# Patient Record
Sex: Male | Born: 1980
Health system: Southern US, Community
[De-identification: ages and names within clinical notes are randomized; demographics above are authoritative.]

## PROBLEM LIST (undated history)

## (undated) DIAGNOSIS — I10 Essential (primary) hypertension: Secondary | ICD-10-CM

## (undated) DIAGNOSIS — K529 Noninfective gastroenteritis and colitis, unspecified: Secondary | ICD-10-CM

## (undated) DIAGNOSIS — F17200 Nicotine dependence, unspecified, uncomplicated: Secondary | ICD-10-CM

## (undated) DIAGNOSIS — Z8619 Personal history of other infectious and parasitic diseases: Secondary | ICD-10-CM

## (undated) HISTORY — PX: LYMPH NODE BIOPSY: SHX201

## (undated) HISTORY — DX: Noninfective gastroenteritis and colitis, unspecified: K52.9

## (undated) HISTORY — PX: TONSILLECTOMY: SUR1361

## (undated) HISTORY — DX: Personal history of other infectious and parasitic diseases: Z86.19

## (undated) HISTORY — DX: Nicotine dependence, unspecified, uncomplicated: F17.200

---

## 2011-05-11 DIAGNOSIS — K529 Noninfective gastroenteritis and colitis, unspecified: Secondary | ICD-10-CM

## 2011-05-11 HISTORY — DX: Noninfective gastroenteritis and colitis, unspecified: K52.9

## 2011-07-16 ENCOUNTER — Emergency Department: Payer: Self-pay | Admitting: Emergency Medicine

## 2011-07-16 ENCOUNTER — Ambulatory Visit: Payer: Self-pay | Admitting: Family Medicine

## 2011-07-16 LAB — CBC
HCT: 48.3 % (ref 40.0–52.0)
HGB: 15.9 g/dL (ref 13.0–18.0)
MCH: 28.2 pg (ref 26.0–34.0)
MCHC: 32.9 g/dL (ref 32.0–36.0)
MCV: 86 fL (ref 80–100)
Platelet: 166 10*3/uL (ref 150–440)
RBC: 5.65 10*6/uL (ref 4.40–5.90)
RDW: 14.1 % (ref 11.5–14.5)
WBC: 8.9 10*3/uL (ref 3.8–10.6)

## 2011-07-16 LAB — BASIC METABOLIC PANEL
Anion Gap: 9 (ref 7–16)
BUN: 15 mg/dL (ref 7–18)
Calcium, Total: 9.2 mg/dL (ref 8.5–10.1)
Chloride: 100 mmol/L (ref 98–107)
Co2: 27 mmol/L (ref 21–32)
Creatinine: 1.23 mg/dL (ref 0.60–1.30)
EGFR (African American): >60
EGFR (Non-African Amer.): >60
Glucose: 90 mg/dL (ref 65–99)
Osmolality: 272 (ref 275–301)
Potassium: 4.6 mmol/L (ref 3.5–5.1)
Sodium: 136 mmol/L (ref 136–145)

## 2011-07-16 LAB — URINALYSIS, COMPLETE
Bacteria: NONE SEEN
Bilirubin,UR: NEGATIVE
Blood: NEGATIVE
Glucose,UR: NEGATIVE mg/dL (ref 0–75)
Ketone: NEGATIVE
Leukocyte Esterase: NEGATIVE
Nitrite: NEGATIVE
Ph: 5 (ref 4.5–8.0)
Protein: NEGATIVE
RBC,UR: NONE SEEN /HPF (ref 0–5)
Specific Gravity: 1.013 (ref 1.003–1.030)
Squamous Epithelial: NONE SEEN
WBC UR: <1 /HPF (ref 0–5)

## 2011-07-17 ENCOUNTER — Encounter (HOSPITAL_COMMUNITY): Admission: EM | Disposition: A | Discharge status: Home / Self Care | Payer: Self-pay | Source: Home / Self Care

## 2011-07-17 ENCOUNTER — Emergency Department (HOSPITAL_COMMUNITY): Payer: BC Managed Care – PPO | Admitting: Anesthesiology

## 2011-07-17 ENCOUNTER — Inpatient Hospital Stay (HOSPITAL_COMMUNITY)
Admission: EM | Admit: 2011-07-17 | Discharge: 2011-07-25 | DRG: 148 | Disposition: A | Discharge status: Home / Self Care | Payer: BC Managed Care – PPO | Attending: Surgery | Admitting: Surgery

## 2011-07-17 ENCOUNTER — Encounter (HOSPITAL_COMMUNITY): Payer: Self-pay | Admitting: Anesthesiology

## 2011-07-17 ENCOUNTER — Encounter (HOSPITAL_COMMUNITY): Payer: Self-pay | Admitting: *Deleted

## 2011-07-17 DIAGNOSIS — K35209 Acute appendicitis with generalized peritonitis, without abscess, unspecified as to perforation: Secondary | ICD-10-CM

## 2011-07-17 DIAGNOSIS — I10 Essential (primary) hypertension: Secondary | ICD-10-CM | POA: Diagnosis present

## 2011-07-17 DIAGNOSIS — K5732 Diverticulitis of large intestine without perforation or abscess without bleeding: Secondary | ICD-10-CM | POA: Diagnosis present

## 2011-07-17 DIAGNOSIS — Z6833 Body mass index (BMI) 33.0-33.9, adult: Secondary | ICD-10-CM

## 2011-07-17 DIAGNOSIS — K352 Acute appendicitis with generalized peritonitis, without abscess: Principal | ICD-10-CM | POA: Diagnosis present

## 2011-07-17 DIAGNOSIS — K929 Disease of digestive system, unspecified: Secondary | ICD-10-CM | POA: Diagnosis not present

## 2011-07-17 DIAGNOSIS — R110 Nausea: Secondary | ICD-10-CM | POA: Diagnosis not present

## 2011-07-17 DIAGNOSIS — F17200 Nicotine dependence, unspecified, uncomplicated: Secondary | ICD-10-CM | POA: Diagnosis present

## 2011-07-17 DIAGNOSIS — K560 Paralytic ileus: Secondary | ICD-10-CM | POA: Diagnosis not present

## 2011-07-17 DIAGNOSIS — R1031 Right lower quadrant pain: Secondary | ICD-10-CM

## 2011-07-17 DIAGNOSIS — E669 Obesity, unspecified: Secondary | ICD-10-CM | POA: Diagnosis present

## 2011-07-17 DIAGNOSIS — Y9210 Unspecified residential institution as the place of occurrence of the external cause: Secondary | ICD-10-CM | POA: Diagnosis not present

## 2011-07-17 DIAGNOSIS — Y836 Removal of other organ (partial) (total) as the cause of abnormal reaction of the patient, or of later complication, without mention of misadventure at the time of the procedure: Secondary | ICD-10-CM | POA: Diagnosis not present

## 2011-07-17 DIAGNOSIS — K37 Unspecified appendicitis: Secondary | ICD-10-CM

## 2011-07-17 HISTORY — PX: LAPAROSCOPIC APPENDECTOMY: SHX408

## 2011-07-17 HISTORY — PX: OTHER SURGICAL HISTORY: SHX169

## 2011-07-17 HISTORY — DX: Essential (primary) hypertension: I10

## 2011-07-17 LAB — COMPREHENSIVE METABOLIC PANEL
ALT: 25 U/L (ref 0–53)
AST: 22 U/L (ref 0–37)
Albumin: 3.9 g/dL (ref 3.5–5.2)
Alkaline Phosphatase: 58 U/L (ref 39–117)
BUN: 13 mg/dL (ref 6–23)
CO2: 25 mEq/L (ref 19–32)
Calcium: 9.6 mg/dL (ref 8.4–10.5)
Chloride: 96 mEq/L (ref 96–112)
Creatinine, Ser: 1.1 mg/dL (ref 0.50–1.35)
GFR calc Af Amer: >90 mL/min (ref >90)
GFR calc non Af Amer: 88 mL/min — ABNORMAL LOW (ref >90)
Glucose, Bld: 111 mg/dL — ABNORMAL HIGH (ref 70–99)
Potassium: 4.1 mEq/L (ref 3.5–5.1)
Sodium: 132 mEq/L — ABNORMAL LOW (ref 135–145)
Total Bilirubin: 0.6 mg/dL (ref 0.3–1.2)
Total Protein: 8.5 g/dL — ABNORMAL HIGH (ref 6.0–8.3)

## 2011-07-17 LAB — DIFFERENTIAL
Basophils Absolute: 0 10*3/uL (ref 0.0–0.1)
Basophils Relative: 0 % (ref 0–1)
Eosinophils Absolute: 0 10*3/uL (ref 0.0–0.7)
Eosinophils Relative: 0 % (ref 0–5)
Lymphocytes Relative: 9 % — ABNORMAL LOW (ref 12–46)
Lymphs Abs: 1.5 10*3/uL (ref 0.7–4.0)
Monocytes Absolute: 1.9 10*3/uL — ABNORMAL HIGH (ref 0.1–1.0)
Monocytes Relative: 11 % (ref 3–12)
Neutro Abs: 14.2 10*3/uL — ABNORMAL HIGH (ref 1.7–7.7)
Neutrophils Relative %: 81 % — ABNORMAL HIGH (ref 43–77)

## 2011-07-17 LAB — URINALYSIS, ROUTINE W REFLEX MICROSCOPIC
Bilirubin Urine: NEGATIVE
Glucose, UA: NEGATIVE mg/dL
Hgb urine dipstick: NEGATIVE
Ketones, ur: 15 mg/dL — AB
Nitrite: NEGATIVE
Protein, ur: NEGATIVE mg/dL
Specific Gravity, Urine: 1.026 (ref 1.005–1.030)
Urobilinogen, UA: 1 mg/dL (ref 0.0–1.0)
pH: 6 (ref 5.0–8.0)

## 2011-07-17 LAB — CBC
HCT: 48.1 % (ref 39.0–52.0)
Hemoglobin: 16.8 g/dL (ref 13.0–17.0)
MCH: 29.4 pg (ref 26.0–34.0)
MCHC: 34.9 g/dL (ref 30.0–36.0)
MCV: 84.1 fL (ref 78.0–100.0)
Platelets: 199 10*3/uL (ref 150–400)
RBC: 5.72 MIL/uL (ref 4.22–5.81)
RDW: 13.9 % (ref 11.5–15.5)
WBC: 17.6 10*3/uL — ABNORMAL HIGH (ref 4.0–10.5)

## 2011-07-17 LAB — URINE MICROSCOPIC-ADD ON

## 2011-07-17 LAB — LIPASE, BLOOD: Lipase: 13 U/L (ref 11–59)

## 2011-07-17 SURGERY — APPENDECTOMY, LAPAROSCOPIC
Anesthesia: General | Site: Abdomen | Wound class: Dirty or Infected

## 2011-07-17 MED ORDER — FENTANYL CITRATE 0.05 MG/ML IJ SOLN
INTRAMUSCULAR | Status: DC | PRN
  Administered 2011-07-17: 100 ug via INTRAVENOUS
  Administered 2011-07-17: 50 ug via INTRAVENOUS
  Administered 2011-07-17 (×2): 100 ug via INTRAVENOUS
  Administered 2011-07-17: 150 ug via INTRAVENOUS

## 2011-07-17 MED ORDER — ONDANSETRON HCL 4 MG/2ML IJ SOLN: 4.0000 mg | Freq: Four times a day (QID) | INTRAMUSCULAR | Status: DC | PRN

## 2011-07-17 MED ORDER — NEOSTIGMINE METHYLSULFATE 1 MG/ML IJ SOLN
INTRAMUSCULAR | Status: DC | PRN
  Administered 2011-07-17: 2 mg via INTRAVENOUS
  Administered 2011-07-17: 3 mg via INTRAVENOUS

## 2011-07-17 MED ORDER — MIDAZOLAM HCL 5 MG/5ML IJ SOLN
INTRAMUSCULAR | Status: DC | PRN
  Administered 2011-07-17: 2 mg via INTRAVENOUS

## 2011-07-17 MED ORDER — ROCURONIUM BROMIDE 100 MG/10ML IV SOLN
INTRAVENOUS | Status: DC | PRN
  Administered 2011-07-17: 45 mg via INTRAVENOUS
  Administered 2011-07-17: 5 mg via INTRAVENOUS
  Administered 2011-07-17: 10 mg via INTRAVENOUS
  Administered 2011-07-17: 20 mg via INTRAVENOUS
  Administered 2011-07-17 (×2): 10 mg via INTRAVENOUS

## 2011-07-17 MED ORDER — DROPERIDOL 2.5 MG/ML IJ SOLN: 0.6250 mg | INTRAMUSCULAR | Status: DC | PRN

## 2011-07-17 MED ORDER — SODIUM CHLORIDE 0.9 % IV BOLUS (SEPSIS)
1000.0000 mL | Freq: Once | INTRAVENOUS | Status: AC
  Administered 2011-07-17: 1000 mL via INTRAVENOUS

## 2011-07-17 MED ORDER — ONDANSETRON HCL 4 MG/2ML IJ SOLN
4.0000 mg | Freq: Once | INTRAMUSCULAR | Status: AC
  Administered 2011-07-17: 4 mg via INTRAVENOUS
  Filled 2011-07-17: qty 2

## 2011-07-17 MED ORDER — SODIUM CHLORIDE 0.9 % IR SOLN
Status: DC | PRN
  Administered 2011-07-17: 2000 mL

## 2011-07-17 MED ORDER — HYDROMORPHONE HCL PF 1 MG/ML IJ SOLN
INTRAMUSCULAR | Status: AC
  Filled 2011-07-17: qty 1

## 2011-07-17 MED ORDER — KCL IN DEXTROSE-NACL 10-5-0.45 MEQ/L-%-% IV SOLN: INTRAVENOUS | Status: DC

## 2011-07-17 MED ORDER — HYDROMORPHONE HCL PF 1 MG/ML IJ SOLN
1.0000 mg | Freq: Once | INTRAMUSCULAR | Status: AC
  Administered 2011-07-17: 1 mg via INTRAVENOUS
  Filled 2011-07-17: qty 1

## 2011-07-17 MED ORDER — HYDROMORPHONE 0.3 MG/ML IV SOLN
INTRAVENOUS | Status: DC
  Administered 2011-07-17: 22:00:00 via INTRAVENOUS
  Administered 2011-07-18: 2.1 mg via INTRAVENOUS
  Administered 2011-07-18: 1.2 mg via INTRAVENOUS
  Administered 2011-07-18: 0.3 mg via INTRAVENOUS
  Administered 2011-07-18 (×3): 1.5 mg via INTRAVENOUS
  Administered 2011-07-19: 3 mg via INTRAVENOUS
  Administered 2011-07-19 (×2): 1.2 mg via INTRAVENOUS
  Administered 2011-07-19: 1.8 mg via INTRAVENOUS
  Administered 2011-07-19: 0.9 mg via INTRAVENOUS
  Administered 2011-07-19: 1.2 mg via INTRAVENOUS
  Administered 2011-07-20: 1.5 mg via INTRAVENOUS
  Administered 2011-07-20: 0.9 mg via INTRAVENOUS
  Administered 2011-07-20: 1.5 mg via INTRAVENOUS
  Administered 2011-07-20: 1.7 mg via INTRAVENOUS
  Administered 2011-07-20: 1.2 mg via INTRAVENOUS
  Administered 2011-07-20: 2.1 mg via INTRAVENOUS
  Administered 2011-07-20 – 2011-07-21 (×2): 1.5 mg via INTRAVENOUS
  Administered 2011-07-21: 1.2 mg via INTRAVENOUS
  Administered 2011-07-21: 1.8 mg via INTRAVENOUS
  Administered 2011-07-21 (×2): 0.6 mg via INTRAVENOUS
  Administered 2011-07-21: 0.3 mg via INTRAVENOUS
  Administered 2011-07-21: 04:00:00 via INTRAVENOUS
  Administered 2011-07-22 (×2): 1.5 mg via INTRAVENOUS
  Administered 2011-07-22: 0.3 mg via INTRAVENOUS
  Administered 2011-07-22: 1.5 mg via INTRAVENOUS
  Administered 2011-07-22: 1.3 mg via INTRAVENOUS
  Administered 2011-07-23: 0.9 mg via INTRAVENOUS
  Administered 2011-07-23: 13:00:00 via INTRAVENOUS
  Administered 2011-07-23 (×2): 1.5 mg via INTRAVENOUS
  Administered 2011-07-23: 0.6 mg via INTRAVENOUS
  Administered 2011-07-23 – 2011-07-24 (×2): 0.9 mg via INTRAVENOUS
  Administered 2011-07-24: 1.2 mg via INTRAVENOUS
  Administered 2011-07-24: 0.3 mg via INTRAVENOUS
  Administered 2011-07-24: 0.6 mg via INTRAVENOUS

## 2011-07-17 MED ORDER — SODIUM CHLORIDE 0.9 % IV SOLN: 3.0000 g | Freq: Four times a day (QID) | INTRAVENOUS | Status: DC

## 2011-07-17 MED ORDER — NALOXONE HCL 0.4 MG/ML IJ SOLN: 0.4000 mg | INTRAMUSCULAR | Status: DC | PRN

## 2011-07-17 MED ORDER — SUCCINYLCHOLINE CHLORIDE 20 MG/ML IJ SOLN
INTRAMUSCULAR | Status: DC | PRN
  Administered 2011-07-17: 100 mg via INTRAVENOUS

## 2011-07-17 MED ORDER — ONDANSETRON HCL 4 MG PO TABS: 4.0000 mg | ORAL_TABLET | Freq: Four times a day (QID) | ORAL | Status: DC | PRN

## 2011-07-17 MED ORDER — SODIUM CHLORIDE 0.9 % IV SOLN
3.0000 g | Freq: Once | INTRAVENOUS | Status: AC
  Administered 2011-07-17: 3 g via INTRAVENOUS
  Filled 2011-07-17: qty 3

## 2011-07-17 MED ORDER — ENOXAPARIN SODIUM 40 MG/0.4ML ~~LOC~~ SOLN: 40.0000 mg | SUBCUTANEOUS | Status: DC

## 2011-07-17 MED ORDER — BUPIVACAINE-EPINEPHRINE 0.25% -1:200000 IJ SOLN
INTRAMUSCULAR | Status: DC | PRN
  Administered 2011-07-17: 20 mL

## 2011-07-17 MED ORDER — PROPOFOL 10 MG/ML IV EMUL
INTRAVENOUS | Status: DC | PRN
  Administered 2011-07-17: 200 mg via INTRAVENOUS

## 2011-07-17 MED ORDER — VECURONIUM BROMIDE 10 MG IV SOLR
INTRAVENOUS | Status: DC | PRN
  Administered 2011-07-17: 1 mg via INTRAVENOUS
  Administered 2011-07-17: 2 mg via INTRAVENOUS

## 2011-07-17 MED ORDER — GLYCOPYRROLATE 0.2 MG/ML IJ SOLN
INTRAMUSCULAR | Status: DC | PRN
  Administered 2011-07-17: .5 mg via INTRAVENOUS
  Administered 2011-07-17: .2 mg via INTRAVENOUS

## 2011-07-17 MED ORDER — HYDROMORPHONE 0.3 MG/ML IV SOLN
INTRAVENOUS | Status: AC
  Filled 2011-07-17: qty 25

## 2011-07-17 MED ORDER — ONDANSETRON HCL 4 MG/2ML IJ SOLN
INTRAMUSCULAR | Status: DC | PRN
  Administered 2011-07-17: 4 mg via INTRAVENOUS

## 2011-07-17 MED ORDER — METRONIDAZOLE IN NACL 5-0.79 MG/ML-% IV SOLN: 500.0000 mg | Freq: Three times a day (TID) | INTRAVENOUS | Status: DC

## 2011-07-17 MED ORDER — SODIUM CHLORIDE 0.9 % IV SOLN
INTRAVENOUS | Status: DC | PRN
  Administered 2011-07-17: 19:00:00 via INTRAVENOUS

## 2011-07-17 MED ORDER — DIPHENHYDRAMINE HCL 12.5 MG/5ML PO ELIX
12.5000 mg | ORAL_SOLUTION | Freq: Four times a day (QID) | ORAL | Status: DC | PRN
  Filled 2011-07-17: qty 5

## 2011-07-17 MED ORDER — HYDROMORPHONE HCL PF 1 MG/ML IJ SOLN
0.2500 mg | INTRAMUSCULAR | Status: DC | PRN
  Administered 2011-07-17 (×4): 0.5 mg via INTRAVENOUS

## 2011-07-17 MED ORDER — LACTATED RINGERS IV SOLN
INTRAVENOUS | Status: DC | PRN
  Administered 2011-07-17 (×2): via INTRAVENOUS

## 2011-07-17 MED ORDER — DIPHENHYDRAMINE HCL 50 MG/ML IJ SOLN
12.5000 mg | Freq: Four times a day (QID) | INTRAMUSCULAR | Status: DC | PRN
  Administered 2011-07-22: 12.5 mg via INTRAVENOUS
  Filled 2011-07-17: qty 1

## 2011-07-17 MED ORDER — LABETALOL HCL 5 MG/ML IV SOLN
INTRAVENOUS | Status: AC
  Administered 2011-07-17 (×2): 5 mg via INTRAVENOUS
  Filled 2011-07-17: qty 4

## 2011-07-17 SURGICAL SUPPLY — 67 items
APPLIER CLIP ROT 10 11.4 M/L (STAPLE)
BLADE SURG 10 STRL SS (BLADE) ×3 IMPLANT
BLADE SURG ROTATE 9660 (MISCELLANEOUS) IMPLANT
CANISTER SUCTION 2500CC (MISCELLANEOUS) ×3 IMPLANT
CHLORAPREP W/TINT 26ML (MISCELLANEOUS) ×3 IMPLANT
CLIP APPLIE ROT 10 11.4 M/L (STAPLE) IMPLANT
CLOTH BEACON ORANGE TIMEOUT ST (SAFETY) ×3 IMPLANT
COVER SURGICAL LIGHT HANDLE (MISCELLANEOUS) ×3 IMPLANT
CUTTER LINEAR ENDO 35 ETS (STAPLE) IMPLANT
CUTTER LINEAR ENDO 35 ETS TH (STAPLE) IMPLANT
DECANTER SPIKE VIAL GLASS SM (MISCELLANEOUS) ×3 IMPLANT
DERMABOND ADVANCED (GAUZE/BANDAGES/DRESSINGS) ×1
DERMABOND ADVANCED .7 DNX12 (GAUZE/BANDAGES/DRESSINGS) ×2 IMPLANT
DRAPE UTILITY 15X26 W/TAPE STR (DRAPE) ×6 IMPLANT
ELECT BLADE 4.0 EZ CLEAN MEGAD (MISCELLANEOUS) ×3
ELECT REM PT RETURN 9FT ADLT (ELECTROSURGICAL) ×3
ELECTRODE BLDE 4.0 EZ CLN MEGD (MISCELLANEOUS) ×2 IMPLANT
ELECTRODE REM PT RTRN 9FT ADLT (ELECTROSURGICAL) ×2 IMPLANT
ENDOLOOP SUT PDS II  0 18 (SUTURE)
ENDOLOOP SUT PDS II 0 18 (SUTURE) IMPLANT
GLOVE BIO SURGEON STRL SZ 6.5 (GLOVE) ×3 IMPLANT
GLOVE BIO SURGEON STRL SZ7.5 (GLOVE) ×3 IMPLANT
GLOVE BIOGEL M STRL SZ7.5 (GLOVE) ×3 IMPLANT
GLOVE BIOGEL PI IND STRL 7.0 (GLOVE) ×2 IMPLANT
GLOVE BIOGEL PI IND STRL 8 (GLOVE) ×4 IMPLANT
GLOVE BIOGEL PI INDICATOR 7.0 (GLOVE) ×1
GLOVE BIOGEL PI INDICATOR 8 (GLOVE) ×2
GLOVE ECLIPSE 7.5 STRL STRAW (GLOVE) ×3 IMPLANT
GOWN PREVENTION PLUS XXLARGE (GOWN DISPOSABLE) ×3 IMPLANT
GOWN STRL NON-REIN LRG LVL3 (GOWN DISPOSABLE) ×9 IMPLANT
KIT BASIN OR (CUSTOM PROCEDURE TRAY) ×3 IMPLANT
KIT ROOM TURNOVER OR (KITS) ×3 IMPLANT
LIGASURE IMPACT 36 18CM CVD LR (INSTRUMENTS) ×3 IMPLANT
NS IRRIG 1000ML POUR BTL (IV SOLUTION) ×6 IMPLANT
PAD ARMBOARD 7.5X6 YLW CONV (MISCELLANEOUS) ×6 IMPLANT
PENCIL BUTTON HOLSTER BLD 10FT (ELECTRODE) ×3 IMPLANT
POUCH SPECIMEN RETRIEVAL 10MM (ENDOMECHANICALS) ×3 IMPLANT
RELOAD /EVU35 (ENDOMECHANICALS) IMPLANT
RELOAD CUTTER ETS 35MM STAND (ENDOMECHANICALS) IMPLANT
RELOAD PROXIMATE 75MM BLUE (ENDOMECHANICALS) ×6 IMPLANT
SET IRRIG TUBING LAPAROSCOPIC (IRRIGATION / IRRIGATOR) ×3 IMPLANT
SPECIMEN JAR SMALL (MISCELLANEOUS) ×3 IMPLANT
SPONGE GAUZE 4X4 12PLY (GAUZE/BANDAGES/DRESSINGS) ×3 IMPLANT
SPONGE LAP 18X18 X RAY DECT (DISPOSABLE) ×3 IMPLANT
STAPLER GUN LINEAR PROX 60 (STAPLE) ×3 IMPLANT
STAPLER PROXIMATE 75MM BLUE (STAPLE) ×3 IMPLANT
STAPLER VISISTAT 35W (STAPLE) ×3 IMPLANT
SUCTION POOLE TIP (SUCTIONS) ×3 IMPLANT
SUT MNCRL AB 4-0 PS2 18 (SUTURE) ×3 IMPLANT
SUT PDS II 0 TP-1 LOOPED 60 (SUTURE) ×12 IMPLANT
SUT SILK 2 0 (SUTURE) ×1
SUT SILK 2 0 SH CR/8 (SUTURE) ×3 IMPLANT
SUT SILK 2-0 18XBRD TIE 12 (SUTURE) ×2 IMPLANT
SUT SILK 3 0 (SUTURE) ×1
SUT SILK 3 0 SH CR/8 (SUTURE) ×3 IMPLANT
SUT SILK 3-0 18XBRD TIE 12 (SUTURE) ×2 IMPLANT
SYR CONTROL 10ML LL (SYRINGE) ×3 IMPLANT
TOWEL OR 17X24 6PK STRL BLUE (TOWEL DISPOSABLE) ×3 IMPLANT
TOWEL OR 17X26 10 PK STRL BLUE (TOWEL DISPOSABLE) ×3 IMPLANT
TRAY FOLEY CATH 14FR (SET/KITS/TRAYS/PACK) ×3 IMPLANT
TRAY LAPAROSCOPIC (CUSTOM PROCEDURE TRAY) ×3 IMPLANT
TROCAR XCEL 12X100 BLDLESS (ENDOMECHANICALS) ×3 IMPLANT
TROCAR XCEL BLUNT TIP 100MML (ENDOMECHANICALS) ×3 IMPLANT
TROCAR XCEL NON-BLD 5MMX100MML (ENDOMECHANICALS) ×3 IMPLANT
TUBE CONNECTING 12X1/4 (SUCTIONS) ×3 IMPLANT
WATER STERILE IRR 1000ML POUR (IV SOLUTION) IMPLANT
YANKAUER SUCT BULB TIP NO VENT (SUCTIONS) ×3 IMPLANT

## 2011-07-17 NOTE — ED Notes (Signed)
Pt consented to sx; pt in gown and report given to OR

## 2011-07-17 NOTE — Op Note (Signed)
OPERATIVE REPORT  DATE OF OPERATION: 07/17/2011  PATIENT:  Timothy Davies  31 y.o. male  PRE-OPERATIVE DIAGNOSIS:  acute appendicitis  POST-OPERATIVE DIAGNOSIS:  cecal perforation with localized peritonitis  PROCEDURE:  Procedure(s): APPENDECTOMY LAPAROSCOPIC ILEOCECETOMY  SURGEON:  Surgeon(s): Frederik Schmidt, MD Atilano Ina, MD,FACS  ASSISTANT: Andrey Campanile, MD  ANESTHESIA:   general  EBL: 150 ml  BLOOD ADMINISTERED: none  DRAINS: Nasogastric Tube and Urinary Catheter (Foley)   SPECIMEN:  Source of Specimen:  terminal ileum and cecum with perforation  COUNTS CORRECT:  YES  PROCEDURE DETAILS: The patient was taken to the operating room and placed on the table in the supine position. An adequate endotracheal anesthetic was administered she was prepped and draped in usual sterile manner exposing the entire abdomen.  After proper time out was performed identifying the patient and the procedure to be performed a supraumbilical midline incision was made using #15 blade and taken down to the midline fascia. We incised the midline fascia using 15 blade and grabbed the edges to call clamps. We bluntly dissected down into the peritoneal cavity using a Kelly clamp. A portioning suture of 0 Vicryl was passed around the fascial opening then a Hassan cannula was passed into the peritoneal cavity and secured in place with a pursestring suture. Carbon dioxide gas was insufflated into the peritoneal cavity up to a maximal pressure percent to 15 mm of mercury.  A right upper quadrant 5 mm cannula in the left lower quadrant 11 mm cannula were passed under direct vision. The patient was placed in  Trendelenburg in the left side was tilted down. The small bowel and colon in the right side was markedly inflamed. There is a necrotic inflammatory process involving the cecum which initially was thought to be a ruptured appendix in retrocecal position. As we mobilized the cecum medially taking down its lateral  attachments we do see those of perforation of the cecum anteriorly and that the appendix still was not visualized. Because of concerns of a perforated colon the decision was made to open.  Removed all cannulas and stopped all be gaseous insufflation. A right transverse abdominal incision at the level of the umbilicus made using #10 blade taken down through the anterior rectus fascia the rectus muscle the posterior rectus sheath into the peritoneal cavity.  Using which some retractors were mobilized right colon the terminal ileum which had this acutely inflamed inflammatory process within it. It did turn out that the patient had an anterior cecal wall perforation which is away from the appendix. The appendix was retrocecal but not acutely inflamed. As we mobilized the terminal ileum and right colon we were able to come across the terminal ileum using a GIA-75 stapler and the ascending colon using the similar stapler. The mesentery was taken between [employer] [facility] clamps and 2-0 silk ties and also using a LigaSure device.  We reapproximated the terminal ileum to the ascending colon using a GIA-75 stapler. A TA 60 was used to close the enterotomy and then the mesentery was closed using 2-0 silk. It was minimal fecal contamination in the right lower quadrant however we irrigated with copious amounts saline solution. Once we had done this we allowed all bowel to fall back into the peritoneal cavity we closed the abdomen using looped 0 PDS suture independently suturing the posterior anterior rectus sheaths. The irrigated subcutaneous with saline and closed the skin using stainless steel staples all needle counts sponge counts and instrument counts were correct.  PATIENT DISPOSITION:  PACU -  hemodynamically stable.   Claiborne Stroble III,Markel Kurtenbach O 3/9/20139:54 PM

## 2011-07-17 NOTE — ED Provider Notes (Signed)
History     CSN: [id number]  Arrival date & time 07/17/11  1515   First MD Initiated Contact with Patient 07/17/11 1610      Chief Complaint  Patient presents with  . Abdominal Pain    (Consider location/radiation/quality/duration/timing/severity/associated sxs/prior treatment) HPI  Patient presents to the ED with complaints of abdominal pain that started yesterday. He was seen at [facility] yesterday for the same and a CT scan was done. Per the patient, they were unable to visualize his appendix. The patient was given abx and pain meds and his RLQ pain has been continuing to worsen. He denies nausea and vomiting and has not had a bowel movement in 2 days. He denies having chronic abdominal pains or a history of RLQ pains. He denies any testicular symptoms or urinary symptoms.  History reviewed. No pertinent past medical history.  History reviewed. No pertinent past surgical history.  No family history on file.  History  Substance Use Topics  . Smoking status: Current Everyday Smoker  . Smokeless tobacco: Not on file  . Alcohol Use: Yes      Review of Systems  All other systems reviewed and are negative.    Allergies  Review of patient's allergies indicates no known allergies.  Home Medications   Current Outpatient Rx  Name Route Sig Dispense Refill  . CIPROFLOXACIN HCL 500 MG PO TABS Oral Take 500 mg by mouth 2 (two) times daily. Started 3/8    . VITAMIN B 12 PO Oral Take 1 mL by mouth daily.    Marland Kitchen HYDROCHLOROTHIAZIDE 25 MG PO TABS Oral Take 25 mg by mouth daily.    Marland Kitchen METRONIDAZOLE 500 MG PO TABS Oral Take 500 mg by mouth 2 (two) times daily. For 7 days Started 3/8    . OVER THE COUNTER MEDICATION Oral Take 1 tablet by mouth daily. [employer] testosterone booster      BP 140/80  Pulse 106  Temp(Src) 98.2 F (36.8 C) (Oral)  Resp 18  SpO2 95%  Physical Exam  Nursing note and vitals reviewed. Constitutional: He appears well-developed and well-nourished. No distress.   HENT:  Head: Normocephalic and atraumatic.  Eyes: Pupils are equal, round, and reactive to light.  Neck: Normal range of motion. Neck supple.  Cardiovascular: Normal rate and regular rhythm.   Pulmonary/Chest: Effort normal.  Abdominal: Soft. Bowel sounds are normal. He exhibits no distension. There is tenderness. There is rebound and guarding (RLQ).  Neurological: He is alert.  Skin: Skin is warm and dry.    ED Course  Procedures (including critical care time)  Labs Reviewed  URINALYSIS, ROUTINE W REFLEX MICROSCOPIC - Abnormal; Notable for the following:    Color, Urine AMBER (*) BIOCHEMICALS MAY BE AFFECTED BY COLOR   Ketones, ur 15 (*)    Leukocytes, UA TRACE (*)    All other components within normal limits  CBC - Abnormal; Notable for the following:    WBC 17.6 (*)    All other components within normal limits  DIFFERENTIAL - Abnormal; Notable for the following:    Neutrophils Relative 81 (*)    Neutro Abs 14.2 (*)    Lymphocytes Relative 9 (*)    Monocytes Absolute 1.9 (*)    All other components within normal limits  COMPREHENSIVE METABOLIC PANEL - Abnormal; Notable for the following:    Sodium 132 (*)    Glucose, Bld 111 (*)    Total Protein 8.5 (*)    GFR calc non Af Amer 88 (*)  All other components within normal limits  LIPASE, BLOOD  URINE MICROSCOPIC-ADD ON   No results found.   1. Appendicitis       MDM  Dr. Anitra Lauth has seen and evaluated patient as well. She has consulted with General Surgery who is coming to evaluate/admit patient and his plan is to take his appendix out. Pt pain is controlled with 1mg  Dilaudid IV and IV fluids.        Dorthula Matas, PA 07/17/11 1753

## 2011-07-17 NOTE — H&P (Signed)
Timothy Davies is an 31 y.o. male.   Chief Complaint: RLQ abdominal pain. HPI: Seen at [facility] yesterday with similar symptoms,   Now more specifically RLQ pain and tenderness.  CT yesterday did not show definitive appendicitis, but possibly colitis, but his symptoms are classic for acute appendicitis..  Without repeating His CT scan I believe that this patient has acute appendicitis, and will perform a diagnostic laparoscopy and lap app.  History reviewed. No pertinent past medical history.  History reviewed. No pertinent past surgical history.  No family history on file. Social History:  reports that he has been smoking.  He does not have any smokeless tobacco history on file. He reports that he drinks alcohol. His drug history not on file.  Allergies: No Known Allergies  Medications Prior to Admission  Medication Dose Route Frequency Provider Last Rate Last Dose  . HYDROmorphone (DILAUDID) injection 1 mg  1 mg Intravenous Once Dorthula Matas, PA   1 mg at 07/17/11 1721  . ondansetron (ZOFRAN) injection 4 mg  4 mg Intravenous Once Dorthula Matas, PA   4 mg at 07/17/11 1721  . sodium chloride 0.9 % bolus 1,000 mL  1,000 mL Intravenous Once Dorthula Matas, PA   1,000 mL at 07/17/11 1723   No current outpatient prescriptions on file as of 07/17/2011.    Results for orders placed during the hospital encounter of 07/17/11 (from the past 48 hour(s))  CBC     Status: Abnormal   Collection Time   07/17/11  3:44 PM      Component Value Range Comment   WBC 17.6 (*) 4.0 - 10.5 (K/uL)    RBC 5.72  4.22 - 5.81 (MIL/uL)    Hemoglobin 16.8  13.0 - 17.0 (g/dL)    HCT 16.1  09.6 - 04.5 (%)    MCV 84.1  78.0 - 100.0 (fL)    MCH 29.4  26.0 - 34.0 (pg)    MCHC 34.9  30.0 - 36.0 (g/dL)    RDW 40.9  81.1 - 91.4 (%)    Platelets 199  150 - 400 (K/uL)   DIFFERENTIAL     Status: Abnormal   Collection Time   07/17/11  3:44 PM      Component Value Range Comment   Neutrophils  Relative 81 (*) 43 - 77 (%)    Neutro Abs 14.2 (*) 1.7 - 7.7 (K/uL)    Lymphocytes Relative 9 (*) 12 - 46 (%)    Lymphs Abs 1.5  0.7 - 4.0 (K/uL)    Monocytes Relative 11  3 - 12 (%)    Monocytes Absolute 1.9 (*) 0.1 - 1.0 (K/uL)    Eosinophils Relative 0  0 - 5 (%)    Eosinophils Absolute 0.0  0.0 - 0.7 (K/uL)    Basophils Relative 0  0 - 1 (%)    Basophils Absolute 0.0  0.0 - 0.1 (K/uL)   COMPREHENSIVE METABOLIC PANEL     Status: Abnormal   Collection Time   07/17/11  3:44 PM      Component Value Range Comment   Sodium 132 (*) 135 - 145 (mEq/L)    Potassium 4.1  3.5 - 5.1 (mEq/L)    Chloride 96  96 - 112 (mEq/L)    CO2 25  19 - 32 (mEq/L)    Glucose, Bld 111 (*) 70 - 99 (mg/dL)    BUN 13  6 - 23 (mg/dL)    Creatinine, Ser 7.82  0.50 - 1.35 (mg/dL)    Calcium 9.6  8.4 - 10.5 (mg/dL)    Total Protein 8.5 (*) 6.0 - 8.3 (g/dL)    Albumin 3.9  3.5 - 5.2 (g/dL)    AST 22  0 - 37 (U/L) HEMOLYZED SPECIMEN, RESULTS MAY BE AFFECTED   ALT 25  0 - 53 (U/L)    Alkaline Phosphatase 58  39 - 117 (U/L)    Total Bilirubin 0.6  0.3 - 1.2 (mg/dL)    GFR calc non Af Amer 88 (*) >90 (mL/min)    GFR calc Af Amer >90  >90 (mL/min)   LIPASE, BLOOD     Status: Normal   Collection Time   07/17/11  3:44 PM      Component Value Range Comment   Lipase 13  11 - 59 (U/L)   URINALYSIS, ROUTINE W REFLEX MICROSCOPIC     Status: Abnormal   Collection Time   07/17/11  3:55 PM      Component Value Range Comment   Color, Urine AMBER (*) YELLOW  BIOCHEMICALS MAY BE AFFECTED BY COLOR   APPearance CLEAR  CLEAR     Specific Gravity, Urine 1.026  1.005 - 1.030     pH 6.0  5.0 - 8.0     Glucose, UA NEGATIVE  NEGATIVE (mg/dL)    Hgb urine dipstick NEGATIVE  NEGATIVE     Bilirubin Urine NEGATIVE  NEGATIVE     Ketones, ur 15 (*) NEGATIVE (mg/dL)    Protein, ur NEGATIVE  NEGATIVE (mg/dL)    Urobilinogen, UA 1.0  0.0 - 1.0 (mg/dL)    Nitrite NEGATIVE  NEGATIVE     Leukocytes, UA TRACE (*) NEGATIVE    URINE  MICROSCOPIC-ADD ON     Status: Normal   Collection Time   07/17/11  3:55 PM      Component Value Range Comment   Squamous Epithelial / LPF RARE  RARE     WBC, UA 0-2  <3 (WBC/hpf)    No results found.  Review of Systems  Constitutional: Negative.   HENT: Negative.   Eyes: Negative.   Respiratory: Negative.   Cardiovascular: Negative.   Gastrointestinal: Positive for nausea, vomiting and abdominal pain (RLQ). Negative for diarrhea and constipation.  Genitourinary: Negative.   Musculoskeletal: Negative.   Skin: Negative.   Neurological: Negative.   Endo/Heme/Allergies: Negative.   Psychiatric/Behavioral: Negative.     Blood pressure 140/80, pulse 106, temperature 98.2 F (36.8 C), temperature source Oral, resp. rate 18, SpO2 95.00%. Physical Exam  Constitutional: He appears well-developed and well-nourished.  HENT:  Head: Normocephalic and atraumatic.  Eyes: Conjunctivae and EOM are normal. Pupils are equal, round, and reactive to light.  Neck: Normal range of motion. Neck supple.  Cardiovascular: Normal rate and regular rhythm.   GI: Soft. Normal appearance and bowel sounds are normal. He exhibits mass. He exhibits no distension and no abdominal bruit. There is tenderness (RLQ tenderness). There is rebound, guarding (Voluntary guarding.) and tenderness at McBurney's point (And positive Rovsing's sign).  Genitourinary: Penis normal.  Musculoskeletal: Normal range of motion.     Assessment/Plan Likely acute appendicitis.  Lap App. Unasyn preop.  Avaneesh Pepitone III,Siya Flurry O 07/17/2011, 6:19 PM

## 2011-07-17 NOTE — Anesthesia Preprocedure Evaluation (Addendum)
Anesthesia Evaluation  Patient identified by MRN, date of birth, ID band Patient awake    Reviewed: Allergy & Precautions, H&P , NPO status , Patient's Chart, lab work & pertinent test results  Airway Mallampati: II TM Distance: >3 FB Neck ROM: Full    Dental  (+) Teeth Intact   Pulmonary          Cardiovascular     Neuro/Psych    GI/Hepatic   Endo/Other    Renal/GU      Musculoskeletal   Abdominal   Peds  Hematology   Anesthesia Other Findings   Reproductive/Obstetrics                           Anesthesia Physical Anesthesia Plan  ASA: II and Emergent  Anesthesia Plan: General   Post-op Pain Management:    Induction: Intravenous, Rapid sequence and Cricoid pressure planned  Airway Management Planned: Oral ETT  Additional Equipment:   Intra-op Plan:   Post-operative Plan: Extubation in OR  Informed Consent: I have reviewed the patients History and Physical, chart, labs and discussed the procedure including the risks, benefits and alternatives for the proposed anesthesia with the patient or authorized representative who has indicated his/her understanding and acceptance.   Dental advisory given  Plan Discussed with: CRNA and Anesthesiologist  Anesthesia Plan Comments:        Anesthesia Quick Evaluation

## 2011-07-17 NOTE — ED Notes (Signed)
abd pain since yesterday he was seen at  [facility] and  ghe was given meds.  rlq pain no nv no bms

## 2011-07-17 NOTE — Transfer of Care (Signed)
Immediate Anesthesia Transfer of Care Note  Patient: Timothy Davies  Procedure(s) Performed: Procedure(s) (LRB): APPENDECTOMY LAPAROSCOPIC (Bilateral) ILEOCECETOMY (N/A)  Patient Location: PACU  Anesthesia Type: General  Level of Consciousness: awake, alert  and oriented  Airway & Oxygen Therapy: Patient Spontanous Breathing and Patient connected to nasal cannula oxygen  Post-op Assessment: Report given to PACU RN, Post -op Vital signs reviewed and stable and Patient moving all extremities  Post vital signs: Reviewed and stable  Complications: No apparent anesthesia complications

## 2011-07-17 NOTE — Anesthesia Postprocedure Evaluation (Signed)
  Anesthesia Post Note  Patient: Timothy Davies  Procedure(s) Performed: Procedure(s) (LRB): APPENDECTOMY LAPAROSCOPIC (Bilateral) ILEOCECETOMY (N/A)  Anesthesia type: general  Patient location: PACU  Post pain: Pain level controlled  Post assessment: Patient's Cardiovascular Status Stable  Last Vitals:  Filed Vitals:   07/17/11 2157  BP: 174/86  Pulse:   Temp:   Resp: 24    Post vital signs: Reviewed and stable  Level of consciousness: sedated  Complications: No apparent anesthesia complications

## 2011-07-18 DIAGNOSIS — K5732 Diverticulitis of large intestine without perforation or abscess without bleeding: Secondary | ICD-10-CM | POA: Diagnosis present

## 2011-07-18 MED ORDER — METRONIDAZOLE IN NACL 5-0.79 MG/ML-% IV SOLN
500.0000 mg | Freq: Three times a day (TID) | INTRAVENOUS | Status: DC
  Administered 2011-07-18 – 2011-07-25 (×22): 500 mg via INTRAVENOUS
  Filled 2011-07-18 (×24): qty 100

## 2011-07-18 MED ORDER — HYDRALAZINE HCL 20 MG/ML IJ SOLN
10.0000 mg | Freq: Four times a day (QID) | INTRAMUSCULAR | Status: DC | PRN
  Filled 2011-07-18: qty 0.5

## 2011-07-18 MED ORDER — HYDROCODONE-ACETAMINOPHEN 5-325 MG PO TABS: 1.0000 | ORAL_TABLET | ORAL | Status: DC | PRN

## 2011-07-18 MED ORDER — HYDROMORPHONE 0.3 MG/ML IV SOLN
INTRAVENOUS | Status: AC
  Administered 2011-07-18: 1.19 mg
  Filled 2011-07-18: qty 25

## 2011-07-18 MED ORDER — CHLORHEXIDINE GLUCONATE 0.12 % MT SOLN
15.0000 mL | Freq: Two times a day (BID) | OROMUCOSAL | Status: DC
  Administered 2011-07-18 – 2011-07-24 (×12): 15 mL via OROMUCOSAL
  Filled 2011-07-18 (×11): qty 15

## 2011-07-18 MED ORDER — DOCUSATE SODIUM 100 MG PO CAPS: 100.0000 mg | ORAL_CAPSULE | Freq: Two times a day (BID) | ORAL | Status: DC

## 2011-07-18 MED ORDER — HYDRALAZINE HCL 10 MG PO TABS
10.0000 mg | ORAL_TABLET | Freq: Three times a day (TID) | ORAL | Status: DC | PRN
  Administered 2011-07-18: 10 mg via ORAL
  Filled 2011-07-18 (×2): qty 1

## 2011-07-18 MED ORDER — HYDROMORPHONE HCL PF 1 MG/ML IJ SOLN: 0.5000 mg | INTRAMUSCULAR | Status: DC | PRN

## 2011-07-18 MED ORDER — ONDANSETRON HCL 4 MG/2ML IJ SOLN
4.0000 mg | Freq: Four times a day (QID) | INTRAMUSCULAR | Status: DC | PRN
  Administered 2011-07-20 – 2011-07-21 (×2): 4 mg via INTRAVENOUS
  Filled 2011-07-18 (×2): qty 2

## 2011-07-18 MED ORDER — SODIUM CHLORIDE 0.9 % IV SOLN
3.0000 g | Freq: Four times a day (QID) | INTRAVENOUS | Status: DC
  Administered 2011-07-18 – 2011-07-25 (×30): 3 g via INTRAVENOUS
  Filled 2011-07-18 (×33): qty 3

## 2011-07-18 MED ORDER — BIOTENE DRY MOUTH MT LIQD
15.0000 mL | Freq: Two times a day (BID) | OROMUCOSAL | Status: DC
  Administered 2011-07-19 – 2011-07-23 (×9): 15 mL via OROMUCOSAL

## 2011-07-18 MED ORDER — KCL IN DEXTROSE-NACL 10-5-0.45 MEQ/L-%-% IV SOLN
INTRAVENOUS | Status: DC
  Administered 2011-07-18 – 2011-07-24 (×8): via INTRAVENOUS
  Filled 2011-07-18 (×21): qty 1000

## 2011-07-18 MED ORDER — HYDROCHLOROTHIAZIDE 25 MG PO TABS
25.0000 mg | ORAL_TABLET | Freq: Every day | ORAL | Status: DC
  Administered 2011-07-18 – 2011-07-24 (×7): 25 mg via ORAL
  Filled 2011-07-18 (×8): qty 1

## 2011-07-18 NOTE — ED Provider Notes (Signed)
Medical screening examination/treatment/procedure(s) were conducted as a shared visit with non-physician practitioner(s) and myself.  I personally evaluated the patient during the encounter Patient with history and exam findings classic for appendicitis. Right lower quadrant pain with rebound and guarding. Surgery consulted and admitted for appendicitis  Gwyneth Sprout, MD 07/18/11 [phone]

## 2011-07-18 NOTE — Progress Notes (Signed)
1 Day Post-Op  Subjective: He feels sore but his pain is controlled. Not having any particular problems.  Objective: Vital signs in last 24 hours: Temp:  [96.6 F (35.9 C)-98.9 F (37.2 C)] 98.5 F (36.9 C) (03/10 0641) Pulse Rate:  [80-106] 101  (03/10 0641) Resp:  [16-38] 25  (03/10 0641) BP: (125-187)/(76-100) 148/86 mmHg (03/10 0641) SpO2:  [95 %-100 %] 96 % (03/10 0641) Weight:  [240 lb (108.863 kg)] 240 lb (108.863 kg) (03/09 2324)   Intake/Output from previous day: 03/09 0701 - 03/10 0700 In: 2599 [I.V.:2599] Out: 1600 [Urine:1350; Blood:250] Intake/Output this shift:     General appearance: alert and no distress Resp: clear to auscultation bilaterally Cardio: regular rate and rhythm, S1, S2 normal, no murmur, click, rub or gallop GI: abdomen is mildly tender consistent with postsurgical changes. Not particularly distended. No bowel sounds.  Incision: Dressing dry and intact  Lab Results:   Basename 07/17/11 1544  WBC 17.6*  HGB 16.8  HCT 48.1  PLT 199   BMET  Basename 07/17/11 1544  NA 132*  K 4.1  CL 96  CO2 25  GLUCOSE 111*  BUN 13  CREATININE 1.10  CALCIUM 9.6   PT/INR No results found for this basename: LABPROT:2,INR:2 in the last 72 hours ABG No results found for this basename: PHART:2,PCO2:2,PO2:2,HCO3:2 in the last 72 hours  MEDS, Scheduled    . ampicillin-sulbactam (UNASYN) IV  3 g Intravenous Once  . ampicillin-sulbactam (UNASYN) IV  3 g Intravenous Q6H  . hydrochlorothiazide  25 mg Oral Daily  . HYDROmorphone      . HYDROmorphone      .  HYDROmorphone (DILAUDID) injection  1 mg Intravenous Once  . HYDROmorphone PCA 0.3 mg/mL   Intravenous Q4H  . labetalol      . metronidazole  500 mg Intravenous Q8H  . ondansetron  4 mg Intravenous Once  . sodium chloride  1,000 mL Intravenous Once  . DISCONTD: ampicillin-sulbactam (UNASYN) IV  3 g Intravenous Q6H  . DISCONTD: docusate sodium  100 mg Oral BID  . DISCONTD: enoxaparin  40 mg  Subcutaneous Q24H  . DISCONTD: metronidazole  500 mg Intravenous Q8H    Studies/Results: No results found.  Assessment: s/p Procedure(s): APPENDECTOMY LAPAROSCOPIC ILEOCECETOMY Stable one day postop right colectomy for cecal perforation  Plan: encouraged ambulation and use of incentive spirometer we'll leave NG tube in today recheck labs in a.m.   LOS: 1 day     Currie Paris, MD, [facility], [place] [phone]   07/18/2011 8:21 AM

## 2011-07-19 ENCOUNTER — Encounter (HOSPITAL_COMMUNITY): Payer: Self-pay | Admitting: General Surgery

## 2011-07-19 LAB — BASIC METABOLIC PANEL
BUN: 13 mg/dL (ref 6–23)
BUN: 12 mg/dL (ref 6–23)
CO2: 29 mEq/L (ref 19–32)
CO2: 28 mEq/L (ref 19–32)
Calcium: 8.8 mg/dL (ref 8.4–10.5)
Calcium: 8.8 mg/dL (ref 8.4–10.5)
Chloride: 95 mEq/L — ABNORMAL LOW (ref 96–112)
Chloride: 95 mEq/L — ABNORMAL LOW (ref 96–112)
Creatinine, Ser: 1.08 mg/dL (ref 0.50–1.35)
Creatinine, Ser: 0.96 mg/dL (ref 0.50–1.35)
GFR calc Af Amer: >90 mL/min (ref >90)
GFR calc Af Amer: >90 mL/min (ref >90)
GFR calc non Af Amer: >90 mL/min (ref >90)
GFR calc non Af Amer: >90 mL/min (ref >90)
Glucose, Bld: 123 mg/dL — ABNORMAL HIGH (ref 70–99)
Glucose, Bld: 110 mg/dL — ABNORMAL HIGH (ref 70–99)
Potassium: 4.2 mEq/L (ref 3.5–5.1)
Potassium: 4.2 mEq/L (ref 3.5–5.1)
Sodium: 133 mEq/L — ABNORMAL LOW (ref 135–145)
Sodium: 132 mEq/L — ABNORMAL LOW (ref 135–145)

## 2011-07-19 LAB — CBC
HCT: 43.2 % (ref 39.0–52.0)
HCT: 42.6 % (ref 39.0–52.0)
Hemoglobin: 14.5 g/dL (ref 13.0–17.0)
Hemoglobin: 14.1 g/dL (ref 13.0–17.0)
MCH: 28.6 pg (ref 26.0–34.0)
MCH: 28.1 pg (ref 26.0–34.0)
MCHC: 33.6 g/dL (ref 30.0–36.0)
MCHC: 33.1 g/dL (ref 30.0–36.0)
MCV: 85.2 fL (ref 78.0–100.0)
MCV: 84.9 fL (ref 78.0–100.0)
Platelets: 182 10*3/uL (ref 150–400)
Platelets: 186 10*3/uL (ref 150–400)
RBC: 5.07 MIL/uL (ref 4.22–5.81)
RBC: 5.02 MIL/uL (ref 4.22–5.81)
RDW: 14 % (ref 11.5–15.5)
RDW: 14 % (ref 11.5–15.5)
WBC: 9.9 10*3/uL (ref 4.0–10.5)
WBC: 10.2 10*3/uL (ref 4.0–10.5)

## 2011-07-19 MED ORDER — HYDROMORPHONE 0.3 MG/ML IV SOLN
INTRAVENOUS | Status: AC
  Filled 2011-07-19: qty 25

## 2011-07-19 MED ORDER — METOPROLOL TARTRATE 1 MG/ML IV SOLN
5.0000 mg | Freq: Four times a day (QID) | INTRAVENOUS | Status: DC
  Administered 2011-07-19 – 2011-07-25 (×24): 5 mg via INTRAVENOUS
  Filled 2011-07-19 (×28): qty 5

## 2011-07-19 MED ORDER — ENOXAPARIN SODIUM 40 MG/0.4ML ~~LOC~~ SOLN
40.0000 mg | Freq: Every day | SUBCUTANEOUS | Status: DC
  Administered 2011-07-19 – 2011-07-24 (×7): 40 mg via SUBCUTANEOUS
  Filled 2011-07-19 (×8): qty 0.4

## 2011-07-19 NOTE — Progress Notes (Signed)
2 Days Post-Op  Subjective: Doing well, c/o some right sided back pain.  Pulled NGT out this AM (was coiled in the back of his throat and very uncomfortable).  No nausea or vomiting.  Has not yet tried clears.  No flatus or BM.  Objective: Vital signs in last 24 hours: Temp:  [97.8 F (36.6 C)-99.9 F (37.7 C)] 99.5 F (37.5 C) (03/11 0522) Pulse Rate:  [96-108] 106  (03/11 0522) Resp:  [20-28] 23  (03/11 0522) BP: (144-163)/(84-120) 163/84 mmHg (03/11 0522) SpO2:  [94 %-97 %] 95 % (03/11 0522) FiO2 (%):  [38 %] 38 % (03/10 1600) Last BM Date: 07/15/11  Intake/Output from previous day: 03/10 0701 - 03/11 0700 In: 2316 [I.V.:2316] Out: 2625 [Urine:1300; Emesis/NG output:1325] Intake/Output this shift:    PE: Pulm: CTA bilaterally  Abd: soft, hypoactive BS, nontender, incision site c/d/i  Lab Results:   Basename 07/17/11 1544  WBC 17.6*  HGB 16.8  HCT 48.1  PLT 199   BMET  Basename 07/17/11 1544  NA 132*  K 4.1  CL 96  CO2 25  GLUCOSE 111*  BUN 13  CREATININE 1.10  CALCIUM 9.6   PT/INR No results found for this basename: LABPROT:2,INR:2 in the last 72 hours   Studies/Results: No results found.  Anti-infectives: Anti-infectives     Start     Dose/Rate Route Frequency Ordered Stop   07/18/11 0200  Ampicillin-Sulbactam (UNASYN) 3 g in sodium chloride 0.9 % 100 mL IVPB       3 g 100 mL/hr over 60 Minutes Intravenous Every 6 hours 07/18/11 0112     07/18/11 0200   metroNIDAZOLE (FLAGYL) IVPB 500 mg        500 mg 100 mL/hr over 60 Minutes Intravenous Every 8 hours 07/18/11 0112     07/17/11 2200   metroNIDAZOLE (FLAGYL) IVPB 500 mg  Status:  Discontinued        500 mg 100 mL/hr over 60 Minutes Intravenous Every 8 hours 07/17/11 2153 07/17/11 2312   07/17/11 2200   Ampicillin-Sulbactam (UNASYN) 3 g in sodium chloride 0.9 % 100 mL IVPB  Status:  Discontinued        3 g 100 mL/hr over 60 Minutes Intravenous Every 6 hours 07/17/11 2153 07/17/11 2312   07/17/11 1845  Ampicillin-Sulbactam (UNASYN) 3 g in sodium chloride 0.9 % 100 mL IVPB       3 g 100 mL/hr over 60 Minutes Intravenous  Once 07/17/11 1834 07/17/11 1958           Assessment/Plan 1. Cecal perforation with peritonitis  2. S/p ileocecetomy and laparoscopic appendectomy 3. HTN  Plan: 1. Patient self-removed NGT this AM.  No N/V, so will leave out at this time.  Low threshold to reinsert if patient becomes nauseated.  Minimal to no bowel sounds this AM, pt w/o flatus so will hold on starting clear liquids at this time. 2. Patient with elevated BPs.  HCTZ 25mg  (home medicine) started 3/10 but not given since pt is NPOt; patient also with hydralazine PRN.  Will recheck BMET this AM.  Start lopressor 5mg  q6 IV for better BP control.  Once taking po, if Cr stable, could consider adding low dose lisinopril (most useful in AAM) vs po beta-blocker (patient with mild tachycardia).   LOS: 2 days    Anaaya Fuster 07/19/2011, 7:59 AM

## 2011-07-19 NOTE — Progress Notes (Signed)
UR complete 

## 2011-07-19 NOTE — Progress Notes (Signed)
Looks good.  No nausea.  Leave NGT out.,

## 2011-07-20 LAB — CBC
HCT: 40.9 % (ref 39.0–52.0)
Hemoglobin: 13.7 g/dL (ref 13.0–17.0)
MCH: 28.2 pg (ref 26.0–34.0)
MCHC: 33.5 g/dL (ref 30.0–36.0)
MCV: 84.2 fL (ref 78.0–100.0)
Platelets: 209 10*3/uL (ref 150–400)
RBC: 4.86 MIL/uL (ref 4.22–5.81)
RDW: 13.6 % (ref 11.5–15.5)
WBC: 7.7 10*3/uL (ref 4.0–10.5)

## 2011-07-20 LAB — BASIC METABOLIC PANEL
BUN: 13 mg/dL (ref 6–23)
CO2: 29 mEq/L (ref 19–32)
Calcium: 9 mg/dL (ref 8.4–10.5)
Chloride: 92 mEq/L — ABNORMAL LOW (ref 96–112)
Creatinine, Ser: 0.83 mg/dL (ref 0.50–1.35)
GFR calc Af Amer: >90 mL/min (ref >90)
GFR calc non Af Amer: >90 mL/min (ref >90)
Glucose, Bld: 97 mg/dL (ref 70–99)
Potassium: 3.6 mEq/L (ref 3.5–5.1)
Sodium: 131 mEq/L — ABNORMAL LOW (ref 135–145)

## 2011-07-20 MED ORDER — HYDROMORPHONE 0.3 MG/ML IV SOLN
INTRAVENOUS | Status: AC
  Administered 2011-07-20: 10:00:00
  Filled 2011-07-20: qty 25

## 2011-07-20 NOTE — Progress Notes (Signed)
3 Days Post-Op  Subjective: Doing well, pain has improved.  Has not had flatus or BM but feels like he could.  No N/V/bloating.  Would like to try clears.    Objective: Vital signs in last 24 hours: Temp:  [97.8 F (36.6 C)-99 F (37.2 C)] 97.9 F (36.6 C) (03/12 0536) Pulse Rate:  [81-106] 88  (03/12 0536) Resp:  [15-22] 20  (03/12 0829) BP: (129-162)/(75-95) 129/75 mmHg (03/12 0536) SpO2:  [95 %-99 %] 95 % (03/12 0829) Last BM Date: 07/15/11  Intake/Output from previous day: 03/11 0701 - 03/12 0700 In: 2743 [I.V.:2343; IV Piggyback:400] Out: 1750 [Urine:1750] Intake/Output this shift:    PE: Pulm: CTA bilaterally  CV: RRR, no murmur  Abd: soft, hypoactive BS, nontender, incision sites c/d/i  Lab Results:   Basename 07/20/11 0511 07/19/11 0950  WBC 7.7 10.2  HGB 13.7 14.1  HCT 40.9 42.6  PLT 209 186   BMET  Basename 07/20/11 0511 07/19/11 0950  NA 131* 132*  K 3.6 4.2  CL 92* 95*  CO2 29 28  GLUCOSE 97 110*  BUN 13 12  CREATININE 0.83 0.96  CALCIUM 9.0 8.8   PT/INR No results found for this basename: LABPROT:2,INR:2 in the last 72 hours   Studies/Results: No results found.  Anti-infectives: Anti-infectives     Start     Dose/Rate Route Frequency Ordered Stop   07/18/11 0200   Ampicillin-Sulbactam (UNASYN) 3 g in sodium chloride 0.9 % 100 mL IVPB        3 g 100 mL/hr over 60 Minutes Intravenous Every 6 hours 07/18/11 0112     07/18/11 0200   metroNIDAZOLE (FLAGYL) IVPB 500 mg        500 mg 100 mL/hr over 60 Minutes Intravenous Every 8 hours 07/18/11 0112     07/17/11 2200   metroNIDAZOLE (FLAGYL) IVPB 500 mg  Status:  Discontinued        500 mg 100 mL/hr over 60 Minutes Intravenous Every 8 hours 07/17/11 2153 07/17/11 2312   07/17/11 2200   Ampicillin-Sulbactam (UNASYN) 3 g in sodium chloride 0.9 % 100 mL IVPB  Status:  Discontinued        3 g 100 mL/hr over 60 Minutes Intravenous Every 6 hours 07/17/11 2153 07/17/11 2312   07/17/11 1845    Ampicillin-Sulbactam (UNASYN) 3 g in sodium chloride 0.9 % 100 mL IVPB        3 g 100 mL/hr over 60 Minutes Intravenous  Once 07/17/11 1834 07/17/11 1958           Assessment/Plan 1. Cecal perforation with peritonitis  2. S/p ileocecetomy and laparoscopic appendectomy 3. HTN  Plan: 1. Patient doing well without NGT.  Will start clear liquids this AM, consider advancing to full liquids this PM.  Hopeful that bowel function is returning. 2. Patient with improved BPs after adding lopressor IV.  Patient reports that he was NOT taking HCTZ at home prior to admission, but will likely need to be d/c'ed home on an anti-hypertensive.  Once tolerating po, will transition to po BP regimen.   LOS: 3 days    Ivy Meriwether 07/20/2011, 8:33 AM

## 2011-07-20 NOTE — Progress Notes (Signed)
Try clears.

## 2011-07-21 MED ORDER — HYDROMORPHONE 0.3 MG/ML IV SOLN
INTRAVENOUS | Status: AC
  Filled 2011-07-21: qty 25

## 2011-07-21 NOTE — Progress Notes (Signed)
Ileus  Cont NGT

## 2011-07-21 NOTE — Progress Notes (Signed)
4 Days Post-Op  Subjective: Had significant nausea last night which required NGT to be placed again.  Patient reports it is much more comfortable this time, although he is very anxious that it will become uncomfortable.  Still no flatus or BM.     Objective: Vital signs in last 24 hours: Temp:  [97.5 F (36.4 C)-98.2 F (36.8 C)] 98.2 F (36.8 C) (03/13 0132) Pulse Rate:  [86-97] 86  (03/13 0132) Resp:  [16-22] 18  (03/13 0333) BP: (136-156)/(82-97) 143/91 mmHg (03/13 0132) SpO2:  [93 %-97 %] 97 % (03/13 0333) Last BM Date: 07/15/11  Intake/Output from previous day: 03/12 0701 - 03/13 0700 In: 2886 [P.O.:600; I.V.:2286] Out: 1950 [Urine:800; Emesis/NG output:1150] Intake/Output this shift:    PE: Pulm: CTA bilaterally  CV: RRR, no murmur  Abd: soft, hypoactive BS, nontender, incision sites c/d/i  Lab Results:   Basename 07/20/11 0511 07/19/11 0950  WBC 7.7 10.2  HGB 13.7 14.1  HCT 40.9 42.6  PLT 209 186   BMET  Basename 07/20/11 0511 07/19/11 0950  NA 131* 132*  K 3.6 4.2  CL 92* 95*  CO2 29 28  GLUCOSE 97 110*  BUN 13 12  CREATININE 0.83 0.96  CALCIUM 9.0 8.8   PT/INR No results found for this basename: LABPROT:2,INR:2 in the last 72 hours   Studies/Results: No results found.  Anti-infectives: Anti-infectives     Start     Dose/Rate Route Frequency Ordered Stop   07/18/11 0200   Ampicillin-Sulbactam (UNASYN) 3 g in sodium chloride 0.9 % 100 mL IVPB        3 g 100 mL/hr over 60 Minutes Intravenous Every 6 hours 07/18/11 0112     07/18/11 0200   metroNIDAZOLE (FLAGYL) IVPB 500 mg        500 mg 100 mL/hr over 60 Minutes Intravenous Every 8 hours 07/18/11 0112     07/17/11 2200   metroNIDAZOLE (FLAGYL) IVPB 500 mg  Status:  Discontinued        500 mg 100 mL/hr over 60 Minutes Intravenous Every 8 hours 07/17/11 2153 07/17/11 2312   07/17/11 2200   Ampicillin-Sulbactam (UNASYN) 3 g in sodium chloride 0.9 % 100 mL IVPB  Status:  Discontinued        3  g 100 mL/hr over 60 Minutes Intravenous Every 6 hours 07/17/11 2153 07/17/11 2312   07/17/11 1845   Ampicillin-Sulbactam (UNASYN) 3 g in sodium chloride 0.9 % 100 mL IVPB        3 g 100 mL/hr over 60 Minutes Intravenous  Once 07/17/11 1834 07/17/11 1958           Assessment/Plan 1. Cecal perforation with peritonitis  2. S/p ileocecetomy and laparoscopic appendectomy 3. HTN  Plan: 1. Patient required NGT to be replaced yesterday evening due to nausea.  Feels much better with it in, will leave to suction at this time since 1.1L out since placed <12 hours ago.  If decreased output this afternoon, could consider clamping trial.  Back to NPO for now while requiring NGT.  No flatus yet.  2. Patient with improved BPs after adding lopressor IV.  Once tolerating po, will transition to po BP regimen (HCTZ was "home" medication but pt not taking).   LOS: 4 days    Nelsie Domino 07/21/2011, 7:56 AM

## 2011-07-22 MED ORDER — HYDROMORPHONE 0.3 MG/ML IV SOLN
INTRAVENOUS | Status: AC
  Filled 2011-07-22: qty 25

## 2011-07-22 NOTE — Progress Notes (Signed)
Ileus.  If no resolution in next 24-48 hours  He will need PICC and TNA

## 2011-07-22 NOTE — Progress Notes (Signed)
5 Days Post-Op  Subjective: Patient feeling well, would like to know when he can go home.  Did pass flatus last night, no BM yet.  Still with 1.2L of brown bilious output out of NGT.  Is tolerating some ice chips with NGT to suction.   Objective: Vital signs in last 24 hours: Temp:  [97.9 F (36.6 C)-98.3 F (36.8 C)] 98.3 F (36.8 C) (03/14 0534) Pulse Rate:  [85-92] 86  (03/14 0534) Resp:  [15-22] 17  (03/14 0534) BP: (128-155)/(73-88) 140/78 mmHg (03/14 0534) SpO2:  [92 %-98 %] 96 % (03/14 0534) Last BM Date: 07/16/11  Intake/Output from previous day: 03/13 0701 - 03/14 0700 In: 1100 [I.V.:800; IV Piggyback:300] Out: 2700 [Urine:1500; Emesis/NG output:1200] Intake/Output this shift:    PE: Abd: soft, hypoactive but improved BS, nontender, incision sites c/d/i  Lab Results:   Basename 07/20/11 0511 07/19/11 0950  WBC 7.7 10.2  HGB 13.7 14.1  HCT 40.9 42.6  PLT 209 186   BMET  Basename 07/20/11 0511 07/19/11 0950  NA 131* 132*  K 3.6 4.2  CL 92* 95*  CO2 29 28  GLUCOSE 97 110*  BUN 13 12  CREATININE 0.83 0.96  CALCIUM 9.0 8.8   PT/INR No results found for this basename: LABPROT:2,INR:2 in the last 72 hours   Studies/Results: No results found.  Anti-infectives: Anti-infectives     Start     Dose/Rate Route Frequency Ordered Stop   07/18/11 0200   Ampicillin-Sulbactam (UNASYN) 3 g in sodium chloride 0.9 % 100 mL IVPB        3 g 100 mL/hr over 60 Minutes Intravenous Every 6 hours 07/18/11 0112     07/18/11 0200   metroNIDAZOLE (FLAGYL) IVPB 500 mg        500 mg 100 mL/hr over 60 Minutes Intravenous Every 8 hours 07/18/11 0112     07/17/11 2200   metroNIDAZOLE (FLAGYL) IVPB 500 mg  Status:  Discontinued        500 mg 100 mL/hr over 60 Minutes Intravenous Every 8 hours 07/17/11 2153 07/17/11 2312   07/17/11 2200   Ampicillin-Sulbactam (UNASYN) 3 g in sodium chloride 0.9 % 100 mL IVPB  Status:  Discontinued        3 g 100 mL/hr over 60 Minutes  Intravenous Every 6 hours 07/17/11 2153 07/17/11 2312   07/17/11 1845   Ampicillin-Sulbactam (UNASYN) 3 g in sodium chloride 0.9 % 100 mL IVPB        3 g 100 mL/hr over 60 Minutes Intravenous  Once 07/17/11 1834 07/17/11 1958           Assessment/Plan 1. Cecal perforation with peritonitis  2. S/p ileocecetomy and laparoscopic appendectomy 3. HTN 4. Post-op ileus   Plan: 1. Patient still with ileus, NGT with 1200cc out over 24 hours.  Continue to suction until decreased output.  +flatus overnight, so hopeful ileus is slowly resolving.  2. Continue lopressor IV.  Once tolerating po, will transition to po BP regimen (HCTZ was "home" medication but pt not taking).   LOS: 5 days    Addalynne Golding 07/22/2011, 7:44 AM

## 2011-07-23 LAB — CBC
HCT: 43.4 % (ref 39.0–52.0)
Hemoglobin: 14.5 g/dL (ref 13.0–17.0)
MCH: 28 pg (ref 26.0–34.0)
MCHC: 33.4 g/dL (ref 30.0–36.0)
MCV: 83.8 fL (ref 78.0–100.0)
Platelets: 297 10*3/uL (ref 150–400)
RBC: 5.18 MIL/uL (ref 4.22–5.81)
RDW: 13.4 % (ref 11.5–15.5)
WBC: 10.5 10*3/uL (ref 4.0–10.5)

## 2011-07-23 LAB — BASIC METABOLIC PANEL
BUN: 17 mg/dL (ref 6–23)
CO2: 28 mEq/L (ref 19–32)
Calcium: 8.8 mg/dL (ref 8.4–10.5)
Chloride: 96 mEq/L (ref 96–112)
Creatinine, Ser: 0.91 mg/dL (ref 0.50–1.35)
GFR calc Af Amer: >90 mL/min (ref >90)
GFR calc non Af Amer: >90 mL/min (ref >90)
Glucose, Bld: 103 mg/dL — ABNORMAL HIGH (ref 70–99)
Potassium: 4 mEq/L (ref 3.5–5.1)
Sodium: 134 mEq/L — ABNORMAL LOW (ref 135–145)

## 2011-07-23 MED ORDER — HYDROMORPHONE 0.3 MG/ML IV SOLN
INTRAVENOUS | Status: AC
  Filled 2011-07-23: qty 25

## 2011-07-23 NOTE — Progress Notes (Signed)
INITIAL ADULT NUTRITION ASSESSMENT Date: 07/23/2011   Time: 3:07 PM Reason for Assessment: NPO/Cl x 6 days  ASSESSMENT: Male 31 y.o.  Dx: Cecal diverticulitis S/p lap appy, ileocecectomy, right colectomy for cecal performation  Hx:  Past Medical History  Diagnosis Date  . Hypertension     Related Meds:     . ampicillin-sulbactam (UNASYN) IV  3 g Intravenous Q6H  . antiseptic oral rinse  15 mL Mouth Rinse q12n4p  . chlorhexidine  15 mL Mouth Rinse BID  . enoxaparin (LOVENOX) injection  40 mg Subcutaneous QHS  . hydrochlorothiazide  25 mg Oral Daily  . HYDROmorphone PCA 0.3 mg/mL   Intravenous Q4H  . HYDROmorphone PCA 0.3 mg/mL      . HYDROmorphone PCA 0.3 mg/mL      . metoprolol  5 mg Intravenous Q6H  . metronidazole  500 mg Intravenous Q8H     Ht: 5\' 11"  (180.3 cm)  Wt: 240 lb (108.863 kg)  Ideal Wt: 78.2 kg % Ideal Wt: 139%  Usual Wt: 240 lbs % Usual Wt: 100%  Body mass index is 33.47 kg/(m^2). Obesity unspecified  Food/Nutrition Related Hx: Normal appetite PTA, no recent weight loss  Labs:  CMP     Component Value Date/Time   NA 134* 07/23/2011 0800   K 4.0 07/23/2011 0800   CL 96 07/23/2011 0800   CO2 28 07/23/2011 0800   GLUCOSE 103* 07/23/2011 0800   BUN 17 07/23/2011 0800   CREATININE 0.91 07/23/2011 0800   CALCIUM 8.8 07/23/2011 0800   PROT 8.5* 07/17/2011 1544   ALBUMIN 3.9 07/17/2011 1544   AST 22 07/17/2011 1544   ALT 25 07/17/2011 1544   ALKPHOS 58 07/17/2011 1544   BILITOT 0.6 07/17/2011 1544   GFRNONAA >90 07/23/2011 0800   GFRAA >90 07/23/2011 0800      Intake/Output Summary (Last 24 hours) at 07/23/11 1507 Last data filed at 07/23/11 1437  Gross per 24 hour  Intake 3178.97 ml  Output   2125 ml  Net 1053.97 ml      Diet Order: NPO  Supplements/Tube Feeding: none  IVF:     dextrose 5 % and 0.45 % NaCl with KCl 10 mEq/L Last Rate: 100 mL/hr at 07/23/11 0700    Estimated Nutritional Needs:   Kcal: 2200-2400 Protein:95-105 gm Fluid: 2.2  - 2.4 L   NGT was removed today. Patient states that he is hungry and ready to eat. MD notes that diet will be advanced over the weekend.   NUTRITION DIAGNOSIS: -Inadequate oral intake (NI-2.1).  Status: Ongoing  RELATED TO: inability to eat  AS EVIDENCE BY: NPO diet  MONITORING/EVALUATION(Goals): Goal: Diet will advance to be able to provide adequate nutrition intake Monitor: diet advance, intake, weight, labs  EDUCATION NEEDS: -No education needs identified at this time  INTERVENTION: No nutrition intervention at this time. RD will continue to monitor for diet advance and make recommendations if diet is unable to be advanced  Dietitian [phone]  DOCUMENTATION CODES Per approved criteria  -Obesity Unspecified    Clarene Duke MARIE 07/23/2011, 3:07 PM

## 2011-07-23 NOTE — Progress Notes (Signed)
Ileus resolving.  Advance diet over next few days

## 2011-07-23 NOTE — Progress Notes (Signed)
6 Days Post-Op  Subjective: Patient feeling well. +BM and flatus yesterday.  No nausea or bloating.  Would like NGT out.  Some right-sided pain but overall doing better.   Objective: Vital signs in last 24 hours: Temp:  [97.4 F (36.3 C)-98.6 F (37 C)] 98 F (36.7 C) (03/15 0540) Pulse Rate:  [80-89] 89  (03/15 0540) Resp:  [11-22] 16  (03/15 0540) BP: (112-142)/(78-89) 137/78 mmHg (03/15 0540) SpO2:  [84 %-97 %] 94 % (03/15 0540) Last BM Date: 07/22/11  Intake/Output from previous day: 03/14 0701 - 03/15 0700 In: 3379 [P.O.:240; I.V.:2539; IV Piggyback:600] Out: 2675 [Urine:825; Emesis/NG output:1850] Intake/Output this shift:    PE: Abd: soft, hypoactive, nontender, incision sites c/d/i  Lab Results:  No results found for this basename: WBC:2,HGB:2,HCT:2,PLT:2 in the last 72 hours BMET No results found for this basename: NA:2,K:2,CL:2,CO2:2,GLUCOSE:2,BUN:2,CREATININE:2,CALCIUM:2 in the last 72 hours PT/INR No results found for this basename: LABPROT:2,INR:2 in the last 72 hours   Studies/Results: No results found.  Anti-infectives: Anti-infectives     Start     Dose/Rate Route Frequency Ordered Stop   07/18/11 0200   Ampicillin-Sulbactam (UNASYN) 3 g in sodium chloride 0.9 % 100 mL IVPB        3 g 100 mL/hr over 60 Minutes Intravenous Every 6 hours 07/18/11 0112     07/18/11 0200   metroNIDAZOLE (FLAGYL) IVPB 500 mg        500 mg 100 mL/hr over 60 Minutes Intravenous Every 8 hours 07/18/11 0112     07/17/11 2200   metroNIDAZOLE (FLAGYL) IVPB 500 mg  Status:  Discontinued        500 mg 100 mL/hr over 60 Minutes Intravenous Every 8 hours 07/17/11 2153 07/17/11 2312   07/17/11 2200   Ampicillin-Sulbactam (UNASYN) 3 g in sodium chloride 0.9 % 100 mL IVPB  Status:  Discontinued        3 g 100 mL/hr over 60 Minutes Intravenous Every 6 hours 07/17/11 2153 07/17/11 2312   07/17/11 1845   Ampicillin-Sulbactam (UNASYN) 3 g in sodium chloride 0.9 % 100 mL IVPB        3 g 100 mL/hr over 60 Minutes Intravenous  Once 07/17/11 1834 07/17/11 1958           Assessment/Plan 1. Cecal perforation with peritonitis  2. S/p ileocecetomy and laparoscopic appendectomy 3. HTN 4. Post-op ileus, resolved   Plan: 1. Ileus appears to be resolved with + flatus and BM.  Will clamp NGT and remove if patient tolerates (did still have 1.8L out of NGT yesterday).  Allow clears once NGT removed.  Advance diet over weekend. No need for TPN at this time.  2. Bowel pathology benign, lymph notes negative, no evidence of malignancy  3. Continue lopressor IV.  Once tolerating po, will transition to po BP regimen (HCTZ was "home" medication but pt not taking).   LOS: 6 days    Timothy Davies 07/23/2011, 7:44 AM

## 2011-07-24 MED ORDER — OXYCODONE-ACETAMINOPHEN 5-325 MG PO TABS
1.0000 | ORAL_TABLET | ORAL | Status: DC | PRN
  Administered 2011-07-25: 2 via ORAL
  Filled 2011-07-24: qty 2

## 2011-07-24 NOTE — Progress Notes (Signed)
Patient ID: Timothy Davies, male   DOB: Apr 13, 1981, 31 y.o.   MRN: [id number] 7 Days Post-Op  Subjective: No complaints today. Moderate occasional right lower quadrant pain which is improving. He is tolerating clear liquids without nausea. Has had several bowel movements.  Objective: Vital signs in last 24 hours: Temp:  [97.9 F (36.6 C)-98.5 F (36.9 C)] 98.1 F (36.7 C) (03/16 0522) Pulse Rate:  [77-101] 77  (03/16 0522) Resp:  [14-19] 16  (03/16 0522) BP: (123-135)/(68-83) 135/77 mmHg (03/16 0522) SpO2:  [94 %-100 %] 96 % (03/16 0522) Last BM Date: 07/22/11  Intake/Output from previous day: 03/15 0701 - 03/16 0700 In: 2560 [P.O.:240; I.V.:2320] Out: 200 [Urine:200] Intake/Output this shift:    General appearance: alert and no distress GI: normal findings: soft, non-tender Incision/Wound: dressing clean and intact  Lab Results:   Basename 07/23/11 0800  WBC 10.5  HGB 14.5  HCT 43.4  PLT 297   BMET  Basename 07/23/11 0800  NA 134*  K 4.0  CL 96  CO2 28  GLUCOSE 103*  BUN 17  CREATININE 0.91  CALCIUM 8.8     Studies/Results: No results found.  Anti-infectives: Anti-infectives     Start     Dose/Rate Route Frequency Ordered Stop   07/18/11 0200   Ampicillin-Sulbactam (UNASYN) 3 g in sodium chloride 0.9 % 100 mL IVPB        3 g 100 mL/hr over 60 Minutes Intravenous Every 6 hours 07/18/11 0112     07/18/11 0200   metroNIDAZOLE (FLAGYL) IVPB 500 mg        500 mg 100 mL/hr over 60 Minutes Intravenous Every 8 hours 07/18/11 0112     07/17/11 2200   metroNIDAZOLE (FLAGYL) IVPB 500 mg  Status:  Discontinued        500 mg 100 mL/hr over 60 Minutes Intravenous Every 8 hours 07/17/11 2153 07/17/11 2312   07/17/11 2200   Ampicillin-Sulbactam (UNASYN) 3 g in sodium chloride 0.9 % 100 mL IVPB  Status:  Discontinued        3 g 100 mL/hr over 60 Minutes Intravenous Every 6 hours 07/17/11 2153 07/17/11 2312   07/17/11 1845   Ampicillin-Sulbactam (UNASYN) 3 g in  sodium chloride 0.9 % 100 mL IVPB        3 g 100 mL/hr over 60 Minutes Intravenous  Once 07/17/11 1834 07/17/11 1958          Assessment/Plan: s/p Procedure(s): APPENDECTOMY LAPAROSCOPIC ILEOCECETOMY Doing well postoperatively. Will advance to regular diet. Try oral pain medications. Possibly home tomorrow.   LOS: 7 days    Denyce Harr T 07/24/2011

## 2011-07-25 MED ORDER — OXYCODONE-ACETAMINOPHEN 5-325 MG PO TABS: 1.0000 | ORAL_TABLET | ORAL | Status: DC | PRN

## 2011-07-25 NOTE — Discharge Instructions (Signed)
[facility]      [facility] [phone]  OPEN ABDOMINAL SURGERY: POST OP INSTRUCTIONS  Always review your discharge instruction sheet given to you by the facility where your surgery was performed.  IF YOU HAVE DISABILITY OR FAMILY LEAVE FORMS, YOU MUST BRING THEM TO THE OFFICE FOR PROCESSING.  PLEASE DO NOT GIVE THEM TO YOUR DOCTOR.  1. A prescription for pain medication may be given to you upon discharge.  Take your pain medication as prescribed, if needed.  If narcotic pain medicine is not needed, then you may take acetaminophen (Tylenol) or ibuprofen (Advil) as needed. 2. Take your usually prescribed medications unless otherwise directed. 3. If you need a refill on your pain medication, please contact your pharmacy. They will contact our office to request authorization.  Prescriptions will not be filled after 5pm or on week-ends. 4. You should follow a light diet the first few days after arrival home, such as soup and crackers, pudding, etc.unless your doctor has advised otherwise. A high-fiber, low fat diet can be resumed as tolerated.   Be sure to include lots of fluids daily. Most patients will experience some swelling and bruising on the chest and neck area.  Ice packs will help.  Swelling and bruising can take several days to resolve 5. Most patients will experience some swelling and bruising in the area of the incision. Ice pack will help. Swelling and bruising can take several days to resolve..  6. It is common to experience some constipation if taking pain medication after surgery.  Increasing fluid intake and taking a stool softener will usually help or prevent this problem from occurring.  A mild laxative (Milk of Magnesia or Miralax) should be taken according to package directions if there are no bowel movements after 48 hours. 7.  You may have steri-strips (small skin tapes) in place directly over the incision.  These strips should be left on the skin for 7-10 days.  If your  surgeon used skin glue on the incision, you may shower in 24 hours.  The glue will flake off over the next 2-3 weeks.  Any sutures or staples will be removed at the office during your follow-up visit. You may find that a light gauze bandage over your incision may keep your staples from being rubbed or pulled. You may shower and replace the bandage daily. 8. ACTIVITIES:  You may resume regular (light) daily activities beginning the next day--such as daily self-care, walking, climbing stairs--gradually increasing activities as tolerated.  You may have sexual intercourse when it is comfortable.  Refrain from any heavy lifting or straining until approved by your doctor. a. You may drive when you no longer are taking prescription pain medication, you can comfortably wear a seatbelt, and you can safely maneuver your car and apply brakes b. Return to Work: ___________________________________ 9. You should see your doctor in the office for a follow-up appointment approximately two weeks after your surgery.  Make sure that you call for this appointment within a day or two after you arrive home to insure a convenient appointment time. OTHER INSTRUCTIONS:  _____________________________________________________________ _____________________________________________________________  WHEN TO CALL YOUR DOCTOR: 1. Fever over 101.0 2. Inability to urinate 3. Nausea and/or vomiting 4. Extreme swelling or bruising 5. Continued bleeding from incision. 6. Increased pain, redness, or drainage from the incision. 7. Difficulty swallowing or breathing 8. Muscle cramping or spasms. 9. Numbness or tingling in hands or feet or around lips.  The clinic staff is available to   answer your questions during regular business hours.  Please don't hesitate to call and ask to speak to one of the nurses if you have concerns.  For further questions, please visit [email or web address]   

## 2011-07-25 NOTE — Progress Notes (Signed)
Patient ID: Timothy Davies, male   DOB: 1980-06-03, 31 y.o.   MRN: [id number] 8 Days Post-Op  Subjective: No complaints. Denies pain. Tolerating a regular diet and bowels are moving.  Objective: Vital signs in last 24 hours: Temp:  [97.6 F (36.4 C)-98.5 F (36.9 C)] 97.6 F (36.4 C) (03/17 0642) Pulse Rate:  [70-85] 78  (03/17 0642) Resp:  [13-22] 17  (03/17 0642) BP: (120-153)/(70-84) 153/77 mmHg (03/17 0642) SpO2:  [95 %-100 %] 99 % (03/17 0642) Last BM Date: 07/24/11  Intake/Output from previous day: 03/16 0701 - 03/17 0700 In: 2127.1 [P.O.:240; I.V.:1887.1] Out: -  Intake/Output this shift:    General appearance: alert and no distress GI: normal findings: soft, non-tender Incision/Wound: clean without evidence of infection. Staples were removed  Lab Results:   Basename 07/23/11 0800  WBC 10.5  HGB 14.5  HCT 43.4  PLT 297   BMET  Basename 07/23/11 0800  NA 134*  K 4.0  CL 96  CO2 28  GLUCOSE 103*  BUN 17  CREATININE 0.91  CALCIUM 8.8     Studies/Results: No results found.  Anti-infectives: Anti-infectives     Start     Dose/Rate Route Frequency Ordered Stop   07/18/11 0200   Ampicillin-Sulbactam (UNASYN) 3 g in sodium chloride 0.9 % 100 mL IVPB        3 g 100 mL/hr over 60 Minutes Intravenous Every 6 hours 07/18/11 0112     07/18/11 0200   metroNIDAZOLE (FLAGYL) IVPB 500 mg        500 mg 100 mL/hr over 60 Minutes Intravenous Every 8 hours 07/18/11 0112     07/17/11 2200   metroNIDAZOLE (FLAGYL) IVPB 500 mg  Status:  Discontinued        500 mg 100 mL/hr over 60 Minutes Intravenous Every 8 hours 07/17/11 2153 07/17/11 2312   07/17/11 2200   Ampicillin-Sulbactam (UNASYN) 3 g in sodium chloride 0.9 % 100 mL IVPB  Status:  Discontinued        3 g 100 mL/hr over 60 Minutes Intravenous Every 6 hours 07/17/11 2153 07/17/11 2312   07/17/11 1845   Ampicillin-Sulbactam (UNASYN) 3 g in sodium chloride 0.9 % 100 mL IVPB        3 g 100 mL/hr over 60  Minutes Intravenous  Once 07/17/11 1834 07/17/11 1958          Assessment/Plan: s/p Procedure(s): APPENDECTOMY LAPAROSCOPIC ILEOCECETOMY Doing well postoperatively without evidence of complication. Okay for discharge today.   LOS: 8 days    Kerington Hildebrant T 07/25/2011

## 2011-07-26 ENCOUNTER — Telehealth (INDEPENDENT_AMBULATORY_CARE_PROVIDER_SITE_OTHER): Payer: Self-pay | Admitting: General Surgery

## 2011-07-26 NOTE — Telephone Encounter (Signed)
Pt calling in for post op appt.  Please call him at [phone].

## 2011-07-27 NOTE — Discharge Summary (Signed)
I have seen and examined the patient and agree with the assessment and plans.  Makenzi Bannister A. Ardath Lepak  MD, FACS  

## 2011-07-27 NOTE — Discharge Summary (Signed)
Patient ID: Timothy Davies MRN: [id number] DOB/AGE: 07-Dec-1980 31 y.o.  Admit date: 07/17/2011 Discharge date: 07-25-11  Procedures: laparoscopic appendectomy with subsequent ileocecectomy  Consults: None  Reason for Admission: This is a 31 [facility] and [facility].  His scan was equivocal for appendicitis, but showed colitis.  His symptoms seemed c/w appendicitis so he was accepted in transfer to [facility] for the surgery team to evaluate.  Admission Diagnoses:  1. Likely acute appendicitis  Hospital Course: The patient was admitted and taken to the operating room where he underwent a lap appy and then a subsequent ileocecectomy for a cecal perforation.  He had a post op ileus with an NGT that got pulled out accidentally on POD#2.  We left it out, but on POD#4 he developed worsening nausea and emesis and this had to be replaced.  His ileus finally resolved on day 6 when he passed flatus and had a BM.  His NGT was removed and his diet was advanced as tolerates.  His abdominal incision remained stable with staples intact.  These were discontinued prior to discharge home.  He was otherwise stable on POD# 8 for discharge home.  Discharge Diagnoses:  Principal Problem:  *Cecal perforation   Discharge Medications: Medication List  As of 07/27/2011  1:26 PM   STOP taking these medications         ciprofloxacin 500 MG tablet      metroNIDAZOLE 500 MG tablet         TAKE these medications         hydrochlorothiazide 25 MG tablet   Commonly known as: HYDRODIURIL   Take 25 mg by mouth daily.      OVER THE COUNTER MEDICATION   Take 1 tablet by mouth daily. [employer] testosterone booster      oxyCODONE-acetaminophen 5-325 MG per tablet   Commonly known as: PERCOCET   Take 1-2 tablets by mouth every 4 (four) hours as needed.      VITAMIN B 12 PO   Take 1 mL by mouth daily.            Discharge Instructions: Follow-up Information    Follow up  with WYATT Rene Kocher, MD. Schedule an appointment as soon as possible for a visit in 3 weeks.   Contact information:   [facility] [address] [place] [address] [phone]          Signed: Letha Cape 07/27/2011, 1:26 PM

## 2011-08-02 ENCOUNTER — Telehealth (INDEPENDENT_AMBULATORY_CARE_PROVIDER_SITE_OTHER): Payer: Self-pay

## 2011-08-02 NOTE — Telephone Encounter (Signed)
Pt called today c/o gas in the abdominal area, and to report that his wound is "oozing".  He spoke with Dr. Abbey Chatters over the weekend who advised him to call today if his gas pains were no better.  I instructed him to try Gas-X and walk as much as possible to try and expel the gas.  He should also stay away from heavy meals, and drink plenty of liquids.

## 2011-08-03 ENCOUNTER — Telehealth (INDEPENDENT_AMBULATORY_CARE_PROVIDER_SITE_OTHER): Payer: Self-pay | Admitting: General Surgery

## 2011-08-03 NOTE — Telephone Encounter (Signed)
She called stating that he had an open appendectomy and part of his wound was having some drainage. It's been having this drainage for some time and then putting a dry dressing on it 3-4 times a day. She was concerned about this. He is otherwise doing fairly well. I told her with an open wound it is not uncommon to have some of this drainage and as long as she is doing well otherwise to continue the wound care. If the drainage increases, I told her to call the office Monday and get an appointment to be seen.

## 2011-08-04 ENCOUNTER — Encounter (INDEPENDENT_AMBULATORY_CARE_PROVIDER_SITE_OTHER): Payer: Self-pay | Admitting: General Surgery

## 2011-08-04 ENCOUNTER — Ambulatory Visit (INDEPENDENT_AMBULATORY_CARE_PROVIDER_SITE_OTHER): Payer: BC Managed Care – PPO | Admitting: General Surgery

## 2011-08-04 VITALS — BP 138/90 | HR 68 | Ht 71.0 in | Wt 218.4 lb

## 2011-08-04 DIAGNOSIS — IMO0002 Reserved for concepts with insufficient information to code with codable children: Secondary | ICD-10-CM

## 2011-08-04 NOTE — Progress Notes (Signed)
HPI The patient was doing well status post right colectomy for a perforated right-sided colitis. His appendix appeared to be normal however he had significant right colonic inflammation leading to an ileal colectomy.  The patient is complaining of continued drainage from the right corner of his incision. He was a right transverse incision approximately 9-10 cm long which otherwise has healed well with no evidence of infection. There is no pus or enteric drainage from the wound.  The patient is also complaining of diarrhea with recent onset. This is an unlikely secondary to absence of the cecum and the ileocecal valve. Over time I told him that this should improve but to continue his p.o. fluid intake. 3 no need to put him on some type of medication to slow his gallstone if this continues to be a problem.  The patient has lost 25-30 pounds and is looking forward to being able to E. normally again to  PE An examination I examined the right lateral corner of his incision where I cleansed with Betadine then probed with Q-tip getting back maybe 2-3 cc of clear serosanguineous fluid. There appeared to be no large pocket of fluid or pus palpating in that area he was minimally tender except when probing. She had good bowel sounds and appeared to otherwise be normal  Studiy review None  Assessment Status post ileocolectomy for perforated cecal colitis. He is doing very well because he continues to have diarrhea and a small amount of serous drainage from his wound is recommended that a shower and pat dry and covered with antibiotic ointment to this once a day  Because of his continued diarrhea would like to see the patient again in 3 weeks to assess him for prudent to its given up until 22 April for his disability and be out of work.  Plan Return to see me in 3 weeks..  Return to work in 3 weeks.Marland Kitchen

## 2011-08-16 ENCOUNTER — Telehealth (INDEPENDENT_AMBULATORY_CARE_PROVIDER_SITE_OTHER): Payer: Self-pay | Admitting: General Surgery

## 2011-08-17 ENCOUNTER — Encounter (INDEPENDENT_AMBULATORY_CARE_PROVIDER_SITE_OTHER): Payer: Self-pay

## 2011-08-19 ENCOUNTER — Telehealth (INDEPENDENT_AMBULATORY_CARE_PROVIDER_SITE_OTHER): Payer: Self-pay | Admitting: General Surgery

## 2011-08-19 NOTE — Telephone Encounter (Signed)
Pt calling for advice about dealing with some new symptoms: severe gas, worsening over last 2 wks, and now pressure in rectum with pain.  He is unable to pass gas to relieve the pressure; denies fever, cramps, nausea and vomiting.  Paged Dr. Lindie Spruce and updated him--recommended try enema and Miralax.  Advice passed on to pt who understands.  He understands to return to ER if he begins to vomit.  He will call back prn.

## 2011-08-30 ENCOUNTER — Encounter (INDEPENDENT_AMBULATORY_CARE_PROVIDER_SITE_OTHER): Payer: Self-pay | Admitting: General Surgery

## 2011-08-30 ENCOUNTER — Encounter: Payer: Self-pay | Admitting: General Surgery

## 2011-08-30 ENCOUNTER — Ambulatory Visit (INDEPENDENT_AMBULATORY_CARE_PROVIDER_SITE_OTHER): Payer: BC Managed Care – PPO | Admitting: General Surgery

## 2011-08-30 VITALS — BP 146/90 | Ht 71.0 in | Wt 226.4 lb

## 2011-08-30 DIAGNOSIS — Z09 Encounter for follow-up examination after completed treatment for conditions other than malignant neoplasm: Secondary | ICD-10-CM

## 2011-08-30 NOTE — Progress Notes (Signed)
HPI The patient is doing well with some complaints of right-sided abdominal pain when he lays on that side and sleeps on that side.  PE There is no evidence of any type of hernia or wound problem.  Studiy review None  Assessment Doing well status post colectomy for a right-sided perforation and appendectomy to  Plan Return to see me on a p.r.n. basis.  Because the patient continued to have problems after his last visit he is not going to work and I will release him to go back to work on 24 April

## 2012-07-13 ENCOUNTER — Encounter: Payer: Self-pay | Admitting: Family Medicine

## 2012-07-13 ENCOUNTER — Ambulatory Visit (INDEPENDENT_AMBULATORY_CARE_PROVIDER_SITE_OTHER): Payer: BC Managed Care – PPO | Admitting: Family Medicine

## 2012-07-13 VITALS — BP 142/90 | HR 84 | Temp 97.8°F | Ht 71.0 in | Wt 248.2 lb

## 2012-07-13 DIAGNOSIS — F17200 Nicotine dependence, unspecified, uncomplicated: Secondary | ICD-10-CM

## 2012-07-13 DIAGNOSIS — R030 Elevated blood-pressure reading, without diagnosis of hypertension: Secondary | ICD-10-CM

## 2012-07-13 DIAGNOSIS — Z87891 Personal history of nicotine dependence: Secondary | ICD-10-CM | POA: Insufficient documentation

## 2012-07-13 DIAGNOSIS — IMO0001 Reserved for inherently not codable concepts without codable children: Secondary | ICD-10-CM

## 2012-07-13 DIAGNOSIS — I10 Essential (primary) hypertension: Secondary | ICD-10-CM | POA: Insufficient documentation

## 2012-07-13 MED ORDER — VARENICLINE TARTRATE 1 MG PO TABS: 1.0000 mg | ORAL_TABLET | Freq: Two times a day (BID) | ORAL | Status: DC

## 2012-07-13 MED ORDER — VARENICLINE TARTRATE 0.5 MG X 11 & 1 MG X 42 PO MISC: ORAL | Status: DC

## 2012-07-13 NOTE — Patient Instructions (Addendum)
Try chantix. Look into Quitline[place].com Keep an eye on the blood pressure - and if consistently >140/90, return to see me to discuss starting blood pressure medicine Look into mediterranean diet.  DASH Diet The DASH diet stands for "Dietary Approaches to Stop Hypertension." It is a healthy eating plan that has been shown to reduce high blood pressure (hypertension) in as little as 14 days, while also possibly providing other significant health benefits. These other health benefits include reducing the risk of breast cancer after menopause and reducing the risk of type 2 diabetes, heart disease, colon cancer, and stroke. Health benefits also include weight loss and slowing kidney failure in patients with chronic kidney disease.  DIET GUIDELINES  Limit salt (sodium). Your diet should contain less than 1500 mg of sodium daily.  Limit refined or processed carbohydrates. Your diet should include mostly whole grains. Desserts and added sugars should be used sparingly.  Include small amounts of heart-healthy fats. These types of fats include nuts, oils, and tub margarine. Limit saturated and trans fats. These fats have been shown to be harmful in the body. CHOOSING FOODS  The following food groups are based on a 2000 calorie diet. See your Registered Dietitian for individual calorie needs. Grains and Grain Products (6 to 8 servings daily)  Eat More Often: Whole-wheat bread, brown rice, whole-grain or wheat pasta, quinoa, popcorn without added fat or salt (air popped).  Eat Less Often: White bread, white pasta, white rice, cornbread. Vegetables (4 to 5 servings daily)  Eat More Often: Fresh, frozen, and canned vegetables. Vegetables may be raw, steamed, roasted, or grilled with a minimal amount of fat.  Eat Less Often/Avoid: Creamed or fried vegetables. Vegetables in a cheese sauce. Fruit (4 to 5 servings daily)  Eat More Often: All fresh, canned (in natural juice), or frozen fruits. Dried fruits  without added sugar. One hundred percent fruit juice ( cup [237 mL] daily).  Eat Less Often: Dried fruits with added sugar. Canned fruit in light or heavy syrup. [employer], Fish, and Poultry (2 servings or less daily. One serving is 3 to 4 oz [85-114 g]).  Eat More Often: Ninety percent or leaner ground beef, tenderloin, sirloin. Round cuts of beef, chicken breast, [place] breast. All fish. Grill, bake, or broil your meat. Nothing should be fried.  Eat Less Often/Avoid: Fatty cuts of meat, [place], or chicken leg, thigh, or wing. Fried cuts of meat or fish. Dairy (2 to 3 servings)  Eat More Often: Low-fat or fat-free milk, low-fat plain or light yogurt, reduced-fat or part-skim cheese.  Eat Less Often/Avoid: Milk (whole, 2%).Whole milk yogurt. Full-fat cheeses. Nuts, Seeds, and Legumes (4 to 5 servings per week)  Eat More Often: All without added salt.  Eat Less Often/Avoid: Salted nuts and seeds, canned beans with added salt. Fats and Sweets (limited)  Eat More Often: Vegetable oils, tub margarines without trans fats, sugar-free gelatin. Mayonnaise and salad dressings.  Eat Less Often/Avoid: Coconut oils, palm oils, butter, stick margarine, cream, half and half, cookies, candy, pie. FOR MORE INFORMATION The Dash Diet Eating Plan: www.dashdiet.org Document Released: 04/15/2011 Document Revised: 07/19/2011 Document Reviewed: 04/15/2011 [facility] Patient Information 2013 [place], [place].

## 2012-07-13 NOTE — Assessment & Plan Note (Addendum)
Discussed different forms of smoking cessation, encouraged cessation. Pt requests trial of chantix - discussed common side effects including but not limited to nausea/vomiting, vivid dreams, behaviour changes, discussed possible CV risk association. Discussed setting quit date 1 wk into script. http://www.myers.net/ provided.

## 2012-07-13 NOTE — Assessment & Plan Note (Signed)
Mildly elevated today. Discussed DASH diet, mediterranean diet, and increase in potassium rich foods (handout provided). Keep track of bp at home, if consistently >140/90, return for blood work, EKG and start antihypertensive. Pt agrees with plan.

## 2012-07-13 NOTE — Progress Notes (Signed)
Subjective:    Patient ID: Timothy Davies, male    DOB: 1980/09/14, 32 y.o.   MRN: [id number]  HPI CC:new pt to establish  H/o perf cecal colitis s/p ileocolectomy 07/2011 (Dr. Lindie Spruce).  Occasional cramping R lower abdomen with working out.    Elevated BP - recently seen at dentist with very high BPs, filling postponed.  Denies headaches.  Avoids salt.  Good water daily.  Caffeine - 1 pot of coffee/day.    Smoking - 1/4 ppd.  About 5 PY hx.  Has tried nicotine gum as well as cold [place] - 1 mo.  Works out 4x/wk.  Interested in chantix.  Tuesday night elbow to jaw playing basketball - swollen lip and HA which has since resolved.  Preventative: No recent CPE  Caffeine - 1 pot of coffee/day.   Lives with wife and 2 children Occupation: Production assistant, radio Editor, commissioning) Edu: college Activity: [employer] 4 nights/wk Diet: good water, fruits/vegetables daily  Medications and allergies reviewed and updated in chart.  Past histories reviewed and updated if relevant as below. Patient Active Problem List  Diagnosis  . Cecal perforation  . Wound seroma  . Postop check   Past Medical History  Diagnosis Date  . Elevated BP   . Colitis 2013    perforated cecal colitis  . History of chicken pox    Past Surgical History  Procedure Laterality Date  . Laparoscopic appendectomy  07/17/2011    Procedure: APPENDECTOMY LAPAROSCOPIC;  Surgeon: Frederik Schmidt, MD;  Location: [facility] OR;  Service: General;  Laterality: Bilateral;  . Ileocecetomy  07/17/2011    perforated cecal colitis  . Tonsillectomy     History  Substance Use Topics  . Smoking status: Current Every Day Smoker -- 0.25 packs/day for 5 years    Types: Cigarettes  . Smokeless tobacco: Never Used  . Alcohol Use: 3.0 oz/week    5 Cans of beer per week     Comment: Occasional   Family History  Problem Relation Age of Onset  . Hypertension Mother   . Aneurysm Mother 40    brain  . Hypertension Maternal Uncle   . Arthritis  Mother   . Cancer Paternal Aunt     neck  . CAD Maternal Uncle   . Stroke Neg Hx   . Diabetes Neg Hx    No Known Allergies No current outpatient prescriptions on file prior to visit.   No current facility-administered medications on file prior to visit.     Review of Systems  Constitutional: Negative for fever, chills, activity change, appetite change, fatigue and unexpected weight change.  HENT: Negative for hearing loss and neck pain.   Eyes: Negative for visual disturbance.  Respiratory: Negative for cough, chest tightness, shortness of breath and wheezing.   Cardiovascular: Negative for chest pain, palpitations and leg swelling.  Gastrointestinal: Negative for nausea, vomiting, abdominal pain, diarrhea, constipation, blood in stool and abdominal distention.  Genitourinary: Negative for hematuria and difficulty urinating.  Musculoskeletal: Negative for myalgias and arthralgias.  Skin: Negative for rash.  Neurological: Positive for headaches. Negative for dizziness, seizures and syncope.  Hematological: Does not bruise/bleed easily.  Psychiatric/Behavioral: Negative for dysphoric mood. The patient is not nervous/anxious.        Objective:   Physical Exam  Nursing note and vitals reviewed. Constitutional: He is oriented to person, place, and time. He appears well-developed and well-nourished. No distress.  HENT:  Head: Normocephalic and atraumatic.  Right Ear: Hearing, tympanic membrane, external ear  and ear canal normal.  Left Ear: Hearing, tympanic membrane, external ear and ear canal normal.  Nose: Nose normal.  Mouth/Throat: Oropharynx is clear and moist. No oropharyngeal exudate.  Eyes: Conjunctivae and EOM are normal. Pupils are equal, round, and reactive to light. No scleral icterus.  Neck: Normal range of motion. Neck supple.  Cardiovascular: Normal rate, regular rhythm, normal heart sounds and intact distal pulses.   No murmur heard. Pulses:      Radial pulses  are 2+ on the right side, and 2+ on the left side.  Pulmonary/Chest: Effort normal and breath sounds normal. No respiratory distress. He has no wheezes. He has no rales.  Abdominal: Soft. Bowel sounds are normal. He exhibits no distension and no mass. There is no tenderness. There is no rebound and no guarding.  Musculoskeletal: Normal range of motion. He exhibits no edema.  Lymphadenopathy:    He has no cervical adenopathy.  Neurological: He is alert and oriented to person, place, and time.  CN grossly intact, station and gait intact  Skin: Skin is warm and dry. No rash noted.  Psychiatric: He has a normal mood and affect. His behavior is normal. Judgment and thought content normal.      Assessment & Plan:

## 2013-03-21 ENCOUNTER — Ambulatory Visit (INDEPENDENT_AMBULATORY_CARE_PROVIDER_SITE_OTHER): Payer: BC Managed Care – PPO | Admitting: Family Medicine

## 2013-03-21 ENCOUNTER — Encounter: Payer: Self-pay | Admitting: Family Medicine

## 2013-03-21 VITALS — BP 150/100 | HR 76 | Temp 98.1°F | Wt 250.8 lb

## 2013-03-21 DIAGNOSIS — I10 Essential (primary) hypertension: Secondary | ICD-10-CM

## 2013-03-21 DIAGNOSIS — F17200 Nicotine dependence, unspecified, uncomplicated: Secondary | ICD-10-CM

## 2013-03-21 MED ORDER — AMLODIPINE BESYLATE 5 MG PO TABS: 5.0000 mg | ORAL_TABLET | Freq: Every day | ORAL | Status: DC

## 2013-03-21 NOTE — Progress Notes (Signed)
  Subjective:    Patient ID: Timothy Davies, male    DOB: 01/09/1981, 32 y.o.   MRN: [id number]  HPI CC: elevated blood pressure.  BP elevated at work on Thursday - with headache.  Was more stressed but denies other inciting factors.  Checked bp and 190/100. Hasn't done as well as he could with DASH diet.  smoking - continues working on cessation - using chantix intermittently.  No vision changes, chest pain/tightness, sob, swelling of legs.  Mother with brain aneurysm, strong maternal fmhx brain aneurysm.  Past Medical History  Diagnosis Date  . Elevated BP   . Colitis 2013    perforated cecal colitis  . History of chicken pox   . Smoker     Family History  Problem Relation Age of Onset  . Hypertension Mother   . Aneurysm Mother 40    brain  . Hypertension Maternal Uncle   . Arthritis Mother   . Cancer Paternal Aunt     neck  . CAD Maternal Uncle   . Stroke Neg Hx   . Diabetes Neg Hx     Review of Systems Per HPI    Objective:   Physical Exam  Nursing note and vitals reviewed. Constitutional: He appears well-developed and well-nourished. No distress.  HENT:  Mouth/Throat: Oropharynx is clear and moist. No oropharyngeal exudate.  Neck: Normal range of motion. Neck supple. No thyromegaly present.  Cardiovascular: Normal rate, regular rhythm, normal heart sounds and intact distal pulses.   No murmur heard. Pulmonary/Chest: Effort normal and breath sounds normal. No respiratory distress. He has no wheezes. He has no rales.  Abdominal: Soft.  No abd/renal bruits  Musculoskeletal: He exhibits no edema.       Assessment & Plan:

## 2013-03-21 NOTE — Patient Instructions (Addendum)
EKG today. Flu shot today. Start blood pressure medicine called amlodipine 5mg  daily. This has been sent to pharmacy. Return in 1- 2 months for blood pressure follow up  Smoking Cessation Quitting smoking is important to your health and has many advantages. However, it is not always easy to quit since nicotine is a very addictive drug. Often times, people try 3 times or more before being able to quit. This document explains the best ways for you to prepare to quit smoking. Quitting takes hard work and a lot of effort, but you can do it. ADVANTAGES OF QUITTING SMOKING  You will live longer, feel better, and live better.  Your body will feel the impact of quitting smoking almost immediately.  Within 20 minutes, blood pressure decreases. Your pulse returns to its normal level.  After 8 hours, carbon monoxide levels in the blood return to normal. Your oxygen level increases.  After 24 hours, the chance of having a heart attack starts to decrease. Your breath, hair, and body stop smelling like smoke.  After 48 hours, damaged nerve endings begin to recover. Your sense of taste and smell improve.  After 72 hours, the body is virtually free of nicotine. Your bronchial tubes relax and breathing becomes easier.  After 2 to 12 weeks, lungs can hold more air. Exercise becomes easier and circulation improves.  The risk of having a heart attack, stroke, cancer, or lung disease is greatly reduced.  After 1 year, the risk of coronary heart disease is cut in half.  After 5 years, the risk of stroke falls to the same as a nonsmoker.  After 10 years, the risk of lung cancer is cut in half and the risk of other cancers decreases significantly.  After 15 years, the risk of coronary heart disease drops, usually to the level of a nonsmoker.  If you are pregnant, quitting smoking will improve your chances of having a healthy baby.  The people you live with, especially any children, will be  healthier.  You will have extra money to spend on things other than cigarettes. QUESTIONS TO THINK ABOUT BEFORE ATTEMPTING TO QUIT You may want to talk about your answers with your caregiver.  Why do you want to quit?  If you tried to quit in the past, what helped and what did not?  What will be the most difficult situations for you after you quit? How will you plan to handle them?  Who can help you through the tough times? Your family? Friends? A caregiver?  What pleasures do you get from smoking? What ways can you still get pleasure if you quit? Here are some questions to ask your caregiver:  How can you help me to be successful at quitting?  What medicine do you think would be best for me and how should I take it?  What should I do if I need more help?  What is smoking withdrawal like? How can I get information on withdrawal? GET READY  Set a quit date.  Change your environment by getting rid of all cigarettes, ashtrays, matches, and lighters in your home, car, or work. Do not let people smoke in your home.  Review your past attempts to quit. Think about what worked and what did not. GET SUPPORT AND ENCOURAGEMENT You have a better chance of being successful if you have help. You can get support in many ways.  Tell your family, friends, and co-workers that you are going to quit and need their support. Ask  them not to smoke around you.  Get individual, group, or telephone counseling and support. Programs are available at [facility] and [facility]. Call your local health department for information about programs in your area.  Spiritual beliefs and practices may help some smokers quit.  Download a "quit meter" on your computer to keep track of quit statistics, such as how long you have gone without smoking, cigarettes not smoked, and money saved.  Get a self-help book about quitting smoking and staying off of tobacco. LEARN NEW SKILLS AND BEHAVIORS  Distract  yourself from urges to smoke. Talk to someone, go for a walk, or occupy your time with a task.  Change your normal routine. Take a different route to work. Drink tea instead of coffee. Eat breakfast in a different place.  Reduce your stress. Take a hot bath, exercise, or read a book.  Plan something enjoyable to do every day. Reward yourself for not smoking.  Explore interactive web-based programs that specialize in helping you quit. GET MEDICINE AND USE IT CORRECTLY Medicines can help you stop smoking and decrease the urge to smoke. Combining medicine with the above behavioral methods and support can greatly increase your chances of successfully quitting smoking.  Nicotine replacement therapy helps deliver nicotine to your body without the negative effects and risks of smoking. Nicotine replacement therapy includes nicotine gum, lozenges, inhalers, nasal sprays, and skin patches. Some may be available over-the-counter and others require a prescription.  Antidepressant medicine helps people abstain from smoking, but how this works is unknown. This medicine is available by prescription.  Nicotinic receptor partial agonist medicine simulates the effect of nicotine in your brain. This medicine is available by prescription. Ask your caregiver for advice about which medicines to use and how to use them based on your health history. Your caregiver will tell you what side effects to look out for if you choose to be on a medicine or therapy. Carefully read the information on the package. Do not use any other product containing nicotine while using a nicotine replacement product.  RELAPSE OR DIFFICULT SITUATIONS Most relapses occur within the first 3 months after quitting. Do not be discouraged if you start smoking again. Remember, most people try several times before finally quitting. You may have symptoms of withdrawal because your body is used to nicotine. You may crave cigarettes, be irritable, feel  very hungry, cough often, get headaches, or have difficulty concentrating. The withdrawal symptoms are only temporary. They are strongest when you first quit, but they will go away within 10 14 days. To reduce the chances of relapse, try to:  Avoid drinking alcohol. Drinking lowers your chances of successfully quitting.  Reduce the amount of caffeine you consume. Once you quit smoking, the amount of caffeine in your body increases and can give you symptoms, such as a rapid heartbeat, sweating, and anxiety.  Avoid smokers because they can make you want to smoke.  Do not let weight gain distract you. Many smokers will gain weight when they quit, usually less than 10 pounds. Eat a healthy diet and stay active. You can always lose the weight gained after you quit.  Find ways to improve your mood other than smoking. FOR MORE INFORMATION  www.smokefree.gov  Document Released: 04/20/2001 Document Revised: 10/26/2011 Document Reviewed: 08/05/2011 [facility] Patient Information 2014 [place].

## 2013-03-21 NOTE — Assessment & Plan Note (Addendum)
Continues towards cessation.  Encourage continued cessation, handout provided. QuitlineNC.com info previously provided. On chantix.

## 2013-03-21 NOTE — Progress Notes (Signed)
Pre-visit discussion using our clinic review tool. No additional management support is needed unless otherwise documented below in the visit note.  

## 2013-03-21 NOTE — Addendum Note (Signed)
Addended by: Josph Macho A on: 03/21/2013 12:00 PM   Modules accepted: Orders

## 2013-03-21 NOTE — Assessment & Plan Note (Signed)
Persistently elevated with what sounds like hypertensive urgency at work last week. Start amlodipine 5mg  daily - discussed common side effect of ankle edema. rtc 1-2 mo for f/u. Baseline EKG today. Monitor bp at home - let me know if consistently >140/90.

## 2013-03-22 ENCOUNTER — Encounter: Payer: Self-pay | Admitting: *Deleted

## 2013-04-20 ENCOUNTER — Encounter: Payer: Self-pay | Admitting: Family Medicine

## 2013-04-20 ENCOUNTER — Ambulatory Visit (INDEPENDENT_AMBULATORY_CARE_PROVIDER_SITE_OTHER): Payer: BC Managed Care – PPO | Admitting: Family Medicine

## 2013-04-20 VITALS — BP 140/84 | HR 88 | Temp 98.2°F | Wt 253.5 lb

## 2013-04-20 DIAGNOSIS — I10 Essential (primary) hypertension: Secondary | ICD-10-CM

## 2013-04-20 DIAGNOSIS — Z23 Encounter for immunization: Secondary | ICD-10-CM

## 2013-04-20 MED ORDER — AMLODIPINE BESYLATE 10 MG PO TABS: 10.0000 mg | ORAL_TABLET | Freq: Every day | ORAL | Status: DC

## 2013-04-20 NOTE — Addendum Note (Signed)
Addended by: Josph Macho A on: 04/20/2013 02:39 PM   Modules accepted: Orders

## 2013-04-20 NOTE — Progress Notes (Signed)
   Subjective:    Patient ID: Timothy Davies, male    DOB: 05/05/1981, 32 y.o.   MRN: [id number]  HPI CC: 1 mo f/u  Timothy Davies presents today as 1 mo f/u after recently starting antihypertensive therapy in the form of amlodipine 5mg  once daily.  He had hypertensive urgency at work to 190/110.  At work bp has been running 140s/74-80s. No HA, vision changes, CP/tightness, SOB, leg swelling.  Tolerating amlodipine 5mg  daily well.  DASH diet has been discussed.  Has cut back on carbs.  Good fruits and vegetables.  Strong fmhx brain aneurysm. Wt Readings from Last 3 Encounters:  04/20/13 253 lb 8 oz (114.987 kg)  03/21/13 250 lb 12 oz (113.739 kg)  07/13/12 248 lb 4 oz (112.605 kg)     Past Medical History  Diagnosis Date  . Hypertension   . Colitis 2013    perforated cecal colitis  . History of chicken pox   . Smoker     Tdap today - does have 6 mo at home. Review of Systems Per HPI    Objective:   Physical Exam  Nursing note and vitals reviewed. Constitutional: He appears well-developed and well-nourished. No distress.  HENT:  Mouth/Throat: Oropharynx is clear and moist. No oropharyngeal exudate.  Eyes: Conjunctivae and EOM are normal. Pupils are equal, round, and reactive to light. No scleral icterus.  Cardiovascular: Normal rate, regular rhythm, normal heart sounds and intact distal pulses.   No murmur heard. Pulmonary/Chest: Effort normal and breath sounds normal. No respiratory distress. He has no wheezes. He has no rales.  Musculoskeletal: He exhibits no edema.  Skin: Skin is warm and dry. No rash noted.       Assessment & Plan:

## 2013-04-20 NOTE — Assessment & Plan Note (Signed)
Improved control but still a bit high - will increase amlodipine to 10mg  daily. Pt agrees with plan. rtc as needed or in 6 mo for f/u

## 2013-04-20 NOTE — Progress Notes (Signed)
Pre-visit discussion using our clinic review tool. No additional management support is needed unless otherwise documented below in the visit note.  

## 2013-04-20 NOTE — Patient Instructions (Addendum)
Tdap today. I think you are doing well. Let's increase amlodipine to 10mg  daily - new medicinesent to pharmacy  Hypertension As your heart beats, it forces blood through your arteries. This force is your blood pressure. If the pressure is too high, it is called hypertension (HTN) or high blood pressure. HTN is dangerous because you may have it and not know it. High blood pressure may mean that your heart has to work harder to pump blood. Your arteries may be narrow or stiff. The extra work puts you at risk for heart disease, stroke, and other problems.  Blood pressure consists of two numbers, a higher number over a lower, 110/72, for example. It is stated as "110 over 72." The ideal is below 120 for the top number (systolic) and under 80 for the bottom (diastolic). Write down your blood pressure today. You should pay close attention to your blood pressure if you have certain conditions such as:  Heart failure.  Prior heart attack.  Diabetes  Chronic kidney disease.  Prior stroke.  Multiple risk factors for heart disease. To see if you have HTN, your blood pressure should be measured while you are seated with your arm held at the level of the heart. It should be measured at least twice. A one-time elevated blood pressure reading (especially in the Emergency Department) does not mean that you need treatment. There may be conditions in which the blood pressure is different between your right and left arms. It is important to see your caregiver soon for a recheck. Most people have essential hypertension which means that there is not a specific cause. This type of high blood pressure may be lowered by changing lifestyle factors such as:  Stress.  Smoking.  Lack of exercise.  Excessive weight.  Drug/tobacco/alcohol use.  Eating less salt. Most people do not have symptoms from high blood pressure until it has caused damage to the body. Effective treatment can often prevent, delay or reduce  that damage. TREATMENT  When a cause has been identified, treatment for high blood pressure is directed at the cause. There are a large number of medications to treat HTN. These fall into several categories, and your caregiver will help you select the medicines that are best for you. Medications may have side effects. You should review side effects with your caregiver. If your blood pressure stays high after you have made lifestyle changes or started on medicines,   Your medication(s) may need to be changed.  Other problems may need to be addressed.  Be certain you understand your prescriptions, and know how and when to take your medicine.  Be sure to follow up with your caregiver within the time frame advised (usually within two weeks) to have your blood pressure rechecked and to review your medications.  If you are taking more than one medicine to lower your blood pressure, make sure you know how and at what times they should be taken. Taking two medicines at the same time can result in blood pressure that is too low. SEEK IMMEDIATE MEDICAL CARE IF:  You develop a severe headache, blurred or changing vision, or confusion.  You have unusual weakness or numbness, or a faint feeling.  You have severe chest or abdominal pain, vomiting, or breathing problems. MAKE SURE YOU:   Understand these instructions.  Will watch your condition.  Will get help right away if you are not doing well or get worse. Document Released: 04/26/2005 Document Revised: 07/19/2011 Document Reviewed: 12/15/2007 [employer] Patient  Information 2014 [employer].  

## 2013-04-21 IMAGING — CT CT ABD-PELV W/ CM
1 of 2 series · 15 of 32 positions shown, 19 images · non-contrast
Comparison: none

REASON FOR EXAM: (1) pain - migrated from central abd to RLQ, tenderness,
eval for appendicitis;
COMMENTS:

[Series 2: 3mm soft tissue · axial · 0.74mm/px · z∈[-1260,-778]mm · 15 of 175 slices shown, 19 images]
[im 7/175  soft-tissue]
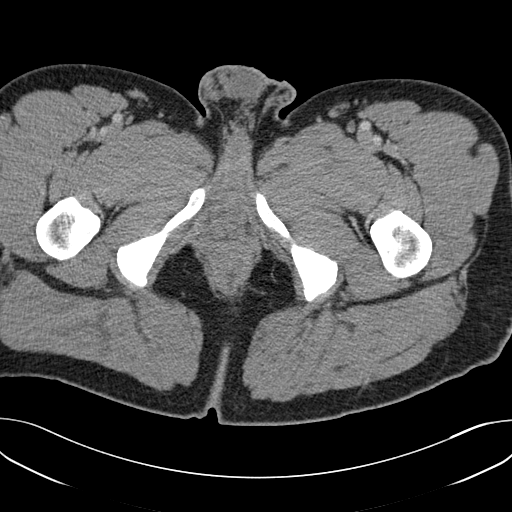
[im 7/175  bone]
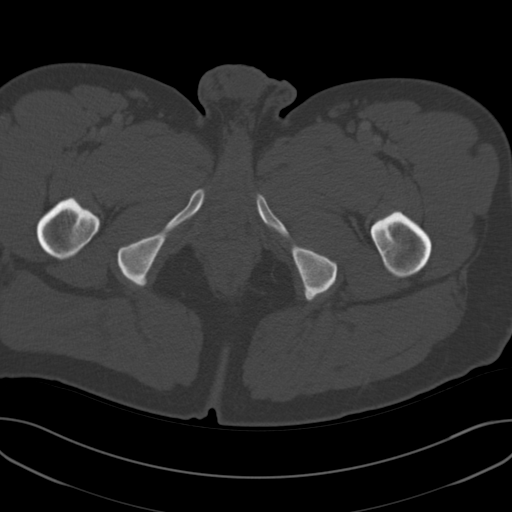
[im 21/175  soft-tissue]
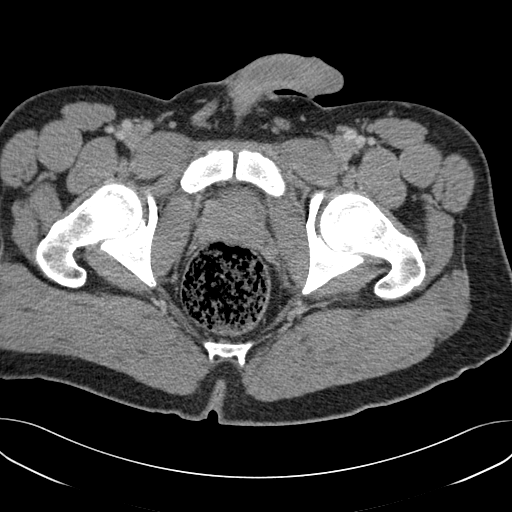
[im 35/175  soft-tissue]
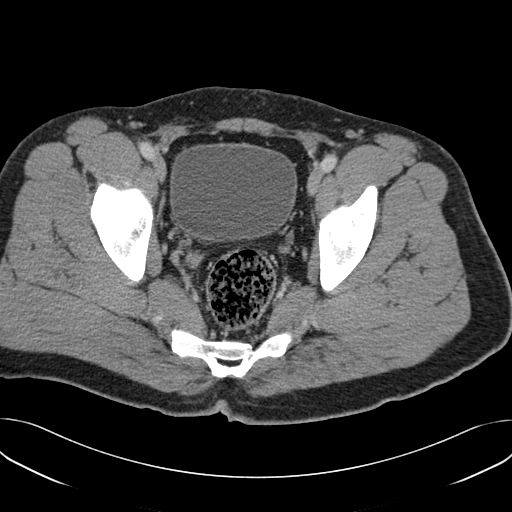
[im 49/175  soft-tissue]
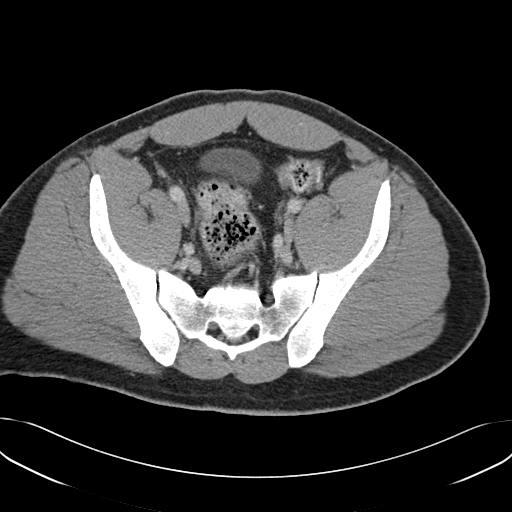
[im 63/175  soft-tissue]
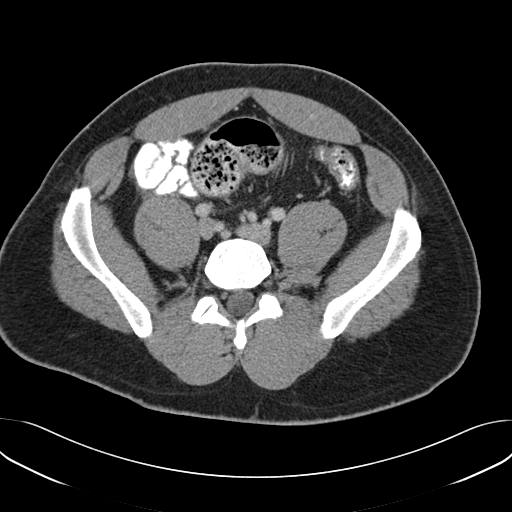
[im 77/175  soft-tissue]
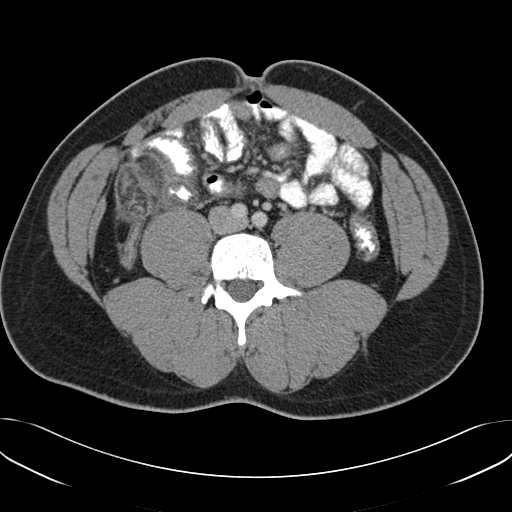
[im 91/175  soft-tissue]
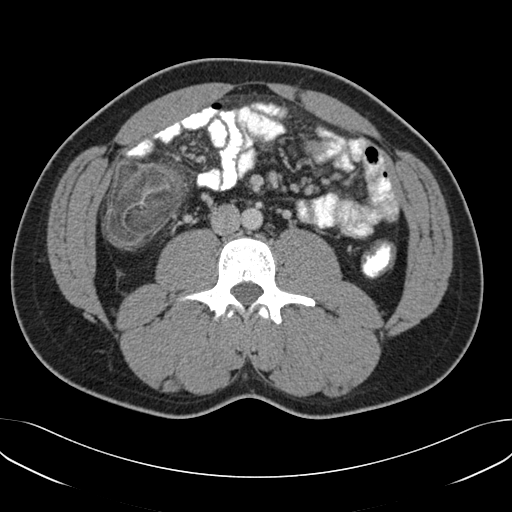
[im 98/175  soft-tissue]
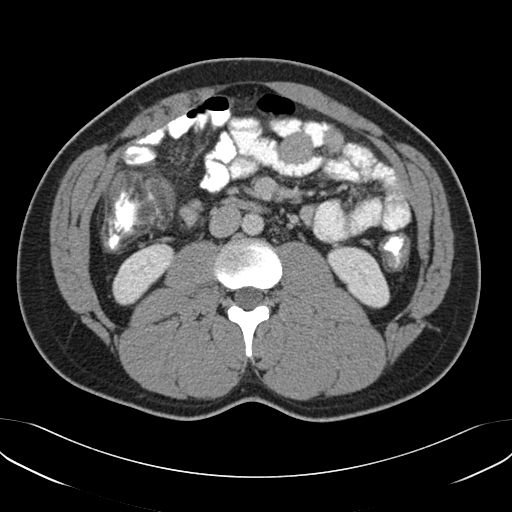
[im 112/175  soft-tissue]
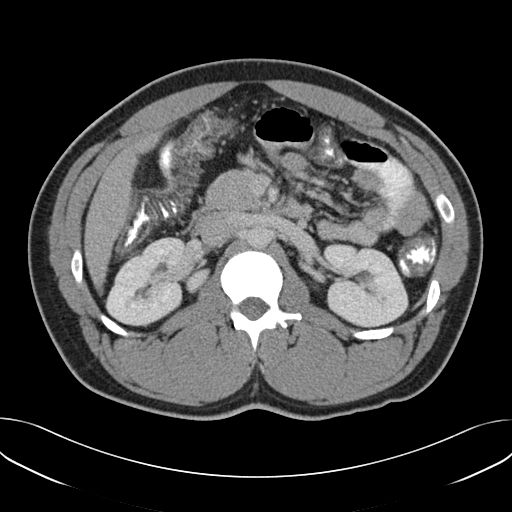
[im 112/175  bone]
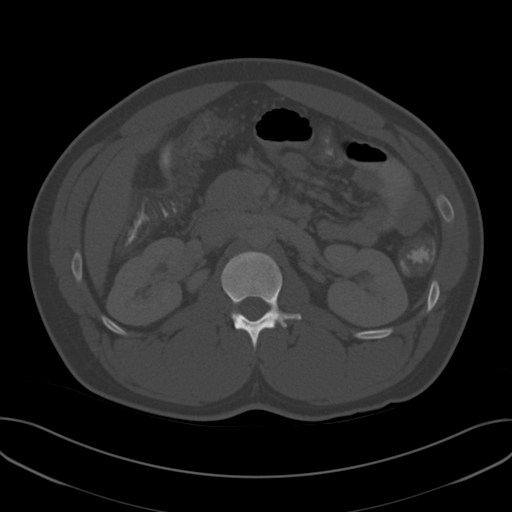
[im 126/175  soft-tissue]
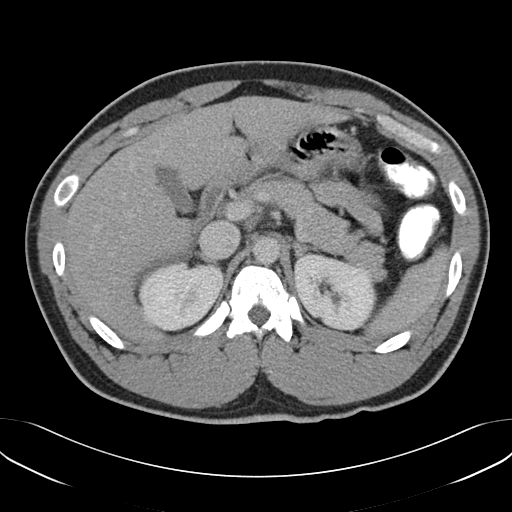
[im 140/175  soft-tissue]
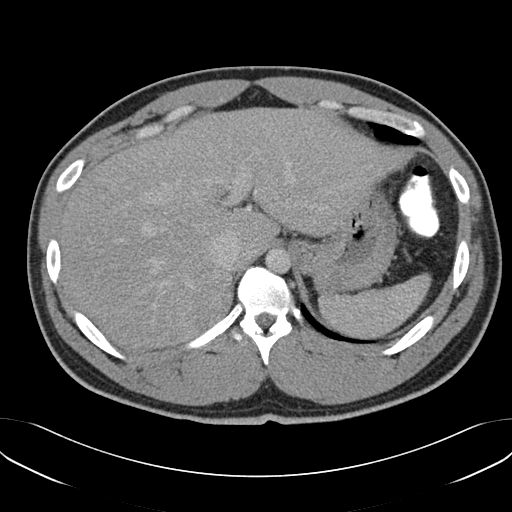
[im 147/175  lung]
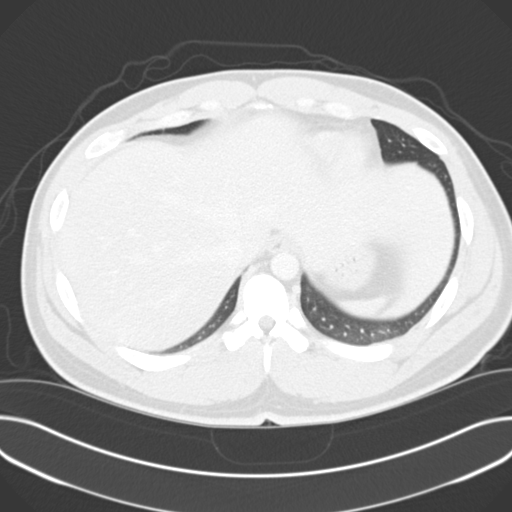
[im 154/175  soft-tissue]
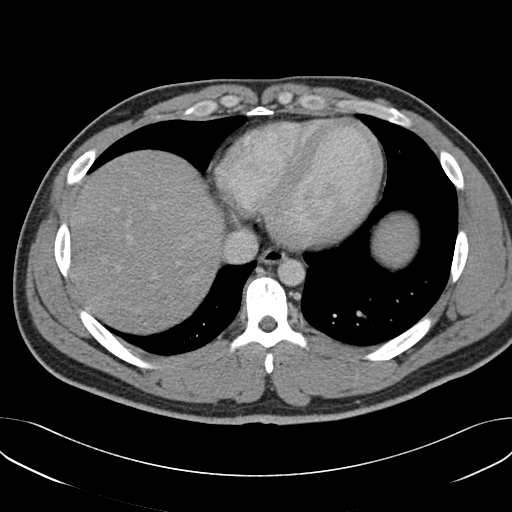
[im 154/175  lung]
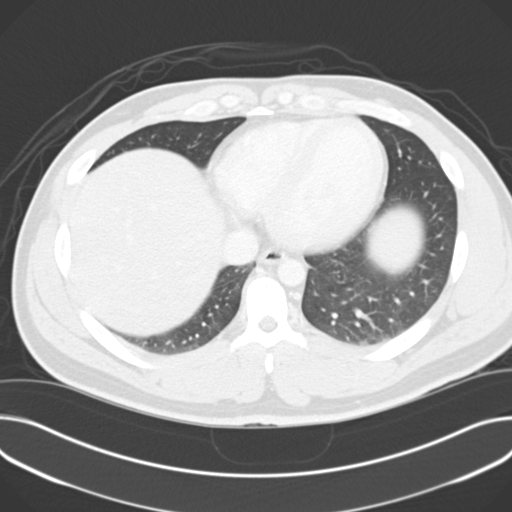
[im 161/175  lung]
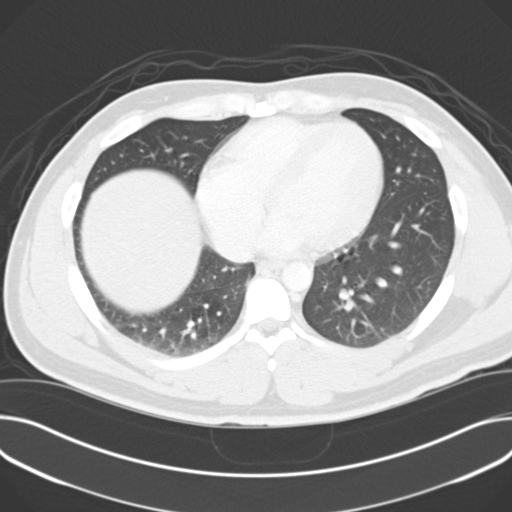
[im 168/175  soft-tissue]
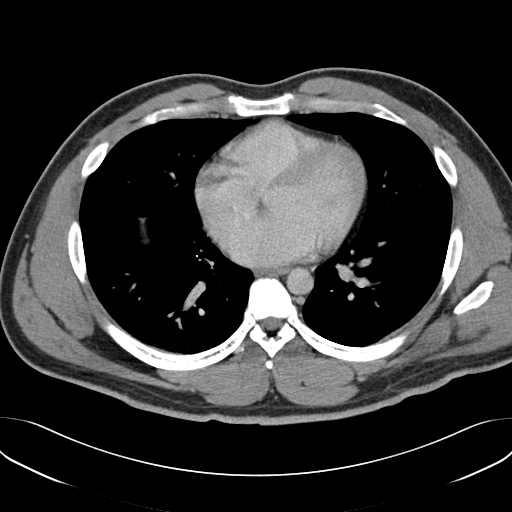
[im 168/175  lung]
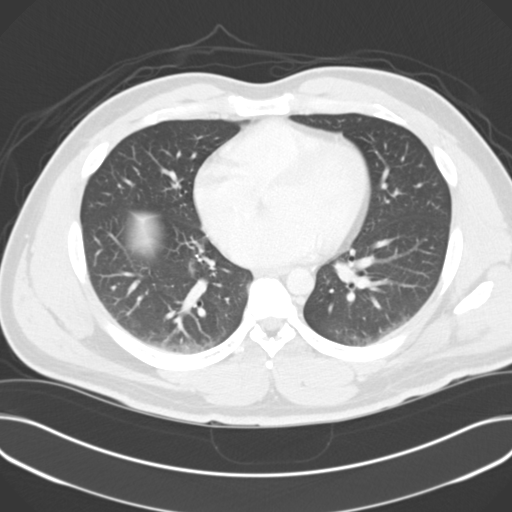

[15 of 32 positions shown; findings below may reference images not displayed]

PROCEDURE:     CT  - CT ABDOMEN / PELVIS  W  - July 16, 2011  [DATE]

RESULT:     CT of the abdomen and pelvis is performed with 100 mL of
[id number] iodinated intravenous contrast and oral contrast. Images are
reconstructed at 3.0 mm slice thickness. There is no previous similar study
for comparison.

Images through the lung bases show minimal dependent atelectasis in both
lower lobes. There is diffuse thickening of the wall of the colon in the
transverse colon, hepatic flexure and ascending colon with relative normal
appearance of the descending colon and some thickening of the wall in the
junction with the sigmoid. A moderately large amount of fecal material is
seen within the rectum and sigmoid colon. No pneumatosis or perforation is
evident. There appears to be slight thickening of the wall of the terminal
ileum extreme distal portion. Is a small amount of fluid in the pericolic
gutter on the right. The urinary bladder contains a small amount of urine.
The appendix is not definitely identified. Some enlarged lymph nodes are
seen medial to the ascending colon on image 78. The liver, spleen, adrenal
glands, kidneys, aorta and bowel are otherwise unremarkable. The stomach is
nondistended. The pancreas shows no ductal dilation or mass. The bony
structures appear within normal limits.
IMPRESSION: Prominent thickening of the wall of the colon especially in
the ascending colon and cecum extending through the hepatic flexure into the
distal transverse colon and again in a short area in the distal descending
colon and proximal sigmoid colon consistent with colitis. There is some
involvement of the terminal ileum as well. No other areas of small bowel
abnormality are seen. Mild adenopathy is seen medial to the cecum and
ascending colon. The appendix is not definitely identified.

## 2014-05-13 ENCOUNTER — Other Ambulatory Visit: Payer: Self-pay | Admitting: Family Medicine

## 2014-06-22 ENCOUNTER — Encounter: Payer: Self-pay | Admitting: Family Medicine

## 2014-06-22 DIAGNOSIS — M222X9 Patellofemoral disorders, unspecified knee: Secondary | ICD-10-CM | POA: Insufficient documentation

## 2014-08-27 ENCOUNTER — Encounter: Payer: Self-pay | Admitting: Family Medicine

## 2014-08-27 ENCOUNTER — Ambulatory Visit (INDEPENDENT_AMBULATORY_CARE_PROVIDER_SITE_OTHER): Payer: BLUE CROSS/BLUE SHIELD | Admitting: Family Medicine

## 2014-08-27 VITALS — BP 138/80 | HR 80 | Temp 98.1°F | Wt 255.1 lb

## 2014-08-27 DIAGNOSIS — Z720 Tobacco use: Secondary | ICD-10-CM | POA: Diagnosis not present

## 2014-08-27 DIAGNOSIS — M542 Cervicalgia: Secondary | ICD-10-CM

## 2014-08-27 DIAGNOSIS — F17200 Nicotine dependence, unspecified, uncomplicated: Secondary | ICD-10-CM

## 2014-08-27 MED ORDER — VARENICLINE TARTRATE 1 MG PO TABS: 1.0000 mg | ORAL_TABLET | Freq: Two times a day (BID) | ORAL | Status: DC

## 2014-08-27 MED ORDER — METHOCARBAMOL 500 MG PO TABS: 500.0000 mg | ORAL_TABLET | Freq: Three times a day (TID) | ORAL | Status: DC | PRN

## 2014-08-27 MED ORDER — VARENICLINE TARTRATE 0.5 MG X 11 & 1 MG X 42 PO MISC: ORAL | Status: DC

## 2014-08-27 NOTE — Patient Instructions (Addendum)
chantix prescription printed out - start when feeling better For neck pain and headache - possible cervical strain leading to headache. Treat with ibuprofen, robaxin, and heating pad. Should continue improving with time. No heavy weightlifting or straining for next week.

## 2014-08-27 NOTE — Assessment & Plan Note (Signed)
Discussed, chantix prescribed. Has used well in past.

## 2014-08-27 NOTE — Assessment & Plan Note (Addendum)
Anticipate cervical neck strain with resultant posterior headache that should continue to improve with time.. nonfocal neurological exam today, no bruits on carotid exam. Treat with robaxin, ibuprofen, heating pad. Update if sxs persist or fail to improve - to obtain neuroimaging (MRI/MRA) given fmhx aneurysm. Pt agrees with plan.

## 2014-08-27 NOTE — Progress Notes (Signed)
BP 138/80 mmHg  Pulse 80  Temp(Src) 98.1 F (36.7 C) (Oral)  Wt 255 lb 1.9 oz (115.722 kg)   CC: neck pain  Subjective:    Patient ID: Timothy Davies, male    DOB: [birth year], 34 y.o.   MRN: [id number]  HPI: Timothy BenedictBillie W Mau is a 34 y.o. male presenting on 08/27/2014 for Neck Pain   6d h/o neck pain. While working out (20 rep squats with 265 lb weights while at crossfit) started having headache described as lightheaded feeling (no presyncope or vertigo) + generalized dull headache that radiated to neck and posterior head. Headache lasted 2 hours. Laid down to rest, and dizziness resolved after a few minutes.   Since then having persistent neck pain worse with exertion. Neck pain described as bilateral tightness. ROM not affected significantly.  Saturday felt better, but sxs recurred as soon as he started jump roping.   Heating pad relieves neck pain. Ibuprofen somewhat helpful.   He actually played basketball yesterday and did not have any trouble with this.  Denies fevers/chills, vision changes, or jaw or arm pain.   Requests chantix refill.  Relevant past medical, surgical, family and social history reviewed and updated as indicated. Interim medical history since our last visit reviewed. Allergies and medications reviewed and updated. Current Outpatient Prescriptions on File Prior to Visit  Medication Sig  . amLODipine (NORVASC) 10 MG tablet Take one tablet every day. **MUST HAVE PHYSICAL FOR FURTHER REFILLS** (Patient not taking: Reported on 08/27/2014)   No current facility-administered medications on file prior to visit.    Review of Systems Per HPI unless specifically indicated above     Objective:    BP 138/80 mmHg  Pulse 80  Temp(Src) 98.1 F (36.7 C) (Oral)  Wt 255 lb 1.9 oz (115.722 kg)  Wt Readings from Last 3 Encounters:  08/27/14 255 lb 1.9 oz (115.722 kg)  04/20/13 253 lb 8 oz (114.987 kg)  03/21/13 250 lb 12 oz (113.739 kg)    Physical Exam    Constitutional: He is oriented to person, place, and time. He appears well-developed and well-nourished. No distress.  Neck: Carotid bruit is not present.  Cardiovascular: Normal rate, regular rhythm, normal heart sounds and intact distal pulses.   No murmur heard. Pulmonary/Chest: Effort normal and breath sounds normal. No respiratory distress. He has no wheezes. He has no rales.  Musculoskeletal: He exhibits no edema.  No midline cervical spine tenderness No paracervical muscle tenderness FROM at neck. No rhomboid tenderness/tightness, no thoracic spine tenderness to palpation  Neurological: He is alert and oriented to person, place, and time. He has normal strength. No cranial nerve deficit or sensory deficit. He displays a negative Romberg sign. Coordination and gait normal.  CN 2-12 intact FTN intact No pronator drift Neg spurling.  Skin: Skin is warm and dry. No rash noted.  Nursing note and vitals reviewed.     Assessment & Plan:   Problem List Items Addressed This Visit    Smoker    Discussed, chantix prescribed. Has used well in past.      Acute neck pain - Primary    Anticipate cervical neck strain with resultant posterior headache that should continue to improve with time.. nonfocal neurological exam today, no bruits on carotid exam. Treat with robaxin, ibuprofen, heating pad. Update if sxs persist or fail to improve - to obtain neuroimaging (MRI/MRA) given fmhx aneurysm. Pt agrees with plan.          Follow  up plan: Return if symptoms worsen or fail to improve.

## 2014-08-27 NOTE — Progress Notes (Signed)
Pre visit review using our clinic review tool, if applicable. No additional management support is needed unless otherwise documented below in the visit note. 

## 2015-10-22 DIAGNOSIS — M9902 Segmental and somatic dysfunction of thoracic region: Secondary | ICD-10-CM | POA: Diagnosis not present

## 2015-10-22 DIAGNOSIS — M9903 Segmental and somatic dysfunction of lumbar region: Secondary | ICD-10-CM | POA: Diagnosis not present

## 2015-10-22 DIAGNOSIS — M542 Cervicalgia: Secondary | ICD-10-CM | POA: Diagnosis not present

## 2015-10-22 DIAGNOSIS — M9901 Segmental and somatic dysfunction of cervical region: Secondary | ICD-10-CM | POA: Diagnosis not present

## 2015-10-23 DIAGNOSIS — M9901 Segmental and somatic dysfunction of cervical region: Secondary | ICD-10-CM | POA: Diagnosis not present

## 2015-10-23 DIAGNOSIS — M9902 Segmental and somatic dysfunction of thoracic region: Secondary | ICD-10-CM | POA: Diagnosis not present

## 2015-10-23 DIAGNOSIS — M9903 Segmental and somatic dysfunction of lumbar region: Secondary | ICD-10-CM | POA: Diagnosis not present

## 2015-10-23 DIAGNOSIS — M542 Cervicalgia: Secondary | ICD-10-CM | POA: Diagnosis not present

## 2015-10-27 DIAGNOSIS — M9902 Segmental and somatic dysfunction of thoracic region: Secondary | ICD-10-CM | POA: Diagnosis not present

## 2015-10-27 DIAGNOSIS — M542 Cervicalgia: Secondary | ICD-10-CM | POA: Diagnosis not present

## 2015-10-27 DIAGNOSIS — M9903 Segmental and somatic dysfunction of lumbar region: Secondary | ICD-10-CM | POA: Diagnosis not present

## 2015-10-27 DIAGNOSIS — M9901 Segmental and somatic dysfunction of cervical region: Secondary | ICD-10-CM | POA: Diagnosis not present

## 2015-12-25 DIAGNOSIS — M7582 Other shoulder lesions, left shoulder: Secondary | ICD-10-CM | POA: Diagnosis not present

## 2016-05-07 ENCOUNTER — Encounter: Payer: Self-pay | Admitting: Family Medicine

## 2016-05-07 ENCOUNTER — Ambulatory Visit (INDEPENDENT_AMBULATORY_CARE_PROVIDER_SITE_OTHER): Payer: BLUE CROSS/BLUE SHIELD | Admitting: Family Medicine

## 2016-05-07 VITALS — BP 140/88 | HR 88 | Temp 98.2°F | Wt 248.0 lb

## 2016-05-07 DIAGNOSIS — I10 Essential (primary) hypertension: Secondary | ICD-10-CM | POA: Diagnosis not present

## 2016-05-07 DIAGNOSIS — Z23 Encounter for immunization: Secondary | ICD-10-CM | POA: Diagnosis not present

## 2016-05-07 DIAGNOSIS — Z87891 Personal history of nicotine dependence: Secondary | ICD-10-CM | POA: Diagnosis not present

## 2016-05-07 LAB — BASIC METABOLIC PANEL
BUN: 14 mg/dL (ref 6–23)
CO2: 23 mEq/L (ref 19–32)
Calcium: 9.1 mg/dL (ref 8.4–10.5)
Chloride: 104 mEq/L (ref 96–112)
Creatinine, Ser: 1.01 mg/dL (ref 0.40–1.50)
GFR: 107.59 mL/min (ref >60.00)
Glucose, Bld: 108 mg/dL — ABNORMAL HIGH (ref 70–99)
Potassium: 4 mEq/L (ref 3.5–5.1)
Sodium: 139 mEq/L (ref 135–145)

## 2016-05-07 LAB — MICROALBUMIN / CREATININE URINE RATIO
Creatinine,U: 271 mg/dL
Microalb Creat Ratio: 1.1 mg/g (ref 0.0–30.0)
Microalb, Ur: 3.1 mg/dL — ABNORMAL HIGH (ref 0.0–1.9)

## 2016-05-07 LAB — TSH: TSH: 0.96 u[IU]/mL (ref 0.35–4.50)

## 2016-05-07 MED ORDER — AMLODIPINE BESYLATE 10 MG PO TABS: 10.0000 mg | ORAL_TABLET | Freq: Every day | ORAL | 3 refills | Status: DC

## 2016-05-07 NOTE — Patient Instructions (Addendum)
Flu shot today Blood work today. Restart amlodipine 10mg  daily. Your goal blood pressure is <140/90. Work on low salt/sodium diet - goal <1.5gm (1,500mg ) per day. Eat a diet high in fruits/vegetables and whole grains.  Look into mediterranean and DASH diet. Goal activity is 12650min/wk of moderate intensity exercise.  This can be split into 30 minute chunks.  If you are not at this level, you can start with smaller 10-15 min increments and slowly build up activity. Look at www.heart.org for more resources.   DASH Eating Plan DASH stands for "Dietary Approaches to Stop Hypertension." The DASH eating plan is a healthy eating plan that has been shown to reduce high blood pressure (hypertension). Additional health benefits may include reducing the risk of type 2 diabetes mellitus, heart disease, and stroke. The DASH eating plan may also help with weight loss. What do I need to know about the DASH eating plan? For the DASH eating plan, you will follow these general guidelines:  Choose foods with less than 150 milligrams of sodium per serving (as listed on the food label).  Use salt-free seasonings or herbs instead of table salt or sea salt.  Check with your health care provider or pharmacist before using salt substitutes.  Eat lower-sodium products. These are often labeled as "low-sodium" or "no salt added."  Eat fresh foods. Avoid eating a lot of canned foods.  Eat more vegetables, fruits, and low-fat dairy products.  Choose whole grains. Look for the word "whole" as the first word in the ingredient list.  Choose fish and skinless chicken or [place] more often than red meat. Limit fish, poultry, and meat to 6 oz (170 g) each day.  Limit sweets, desserts, sugars, and sugary drinks.  Choose heart-healthy fats.  Eat more home-cooked food and less restaurant, buffet, and fast food.  Limit fried foods.  Do not fry foods. Cook foods using methods such as baking, boiling, grilling, and  broiling instead.  When eating at a restaurant, ask that your food be prepared with less salt, or no salt if possible. What foods can I eat? Seek help from a dietitian for individual calorie needs. Grains  Whole grain or whole wheat bread. Brown rice. Whole grain or whole wheat pasta. Quinoa, bulgur, and whole grain cereals. Low-sodium cereals. Corn or whole wheat flour tortillas. Whole grain cornbread. Whole grain crackers. Low-sodium crackers. Vegetables  Fresh or frozen vegetables (raw, steamed, roasted, or grilled). Low-sodium or reduced-sodium tomato and vegetable juices. Low-sodium or reduced-sodium tomato sauce and paste. Low-sodium or reduced-sodium canned vegetables. Fruits  All fresh, canned (in natural juice), or frozen fruits. Meat and Other Protein Products  Ground beef (85% or leaner), grass-fed beef, or beef trimmed of fat. Skinless chicken or [place]key. Ground chicken or [place]key. Pork trimmed of fat. All fish and seafood. Eggs. Dried beans, peas, or lentils. Unsalted nuts and seeds. Unsalted canned beans. Dairy  Low-fat dairy products, such as skim or 1% milk, 2% or reduced-fat cheeses, low-fat ricotta or cottage cheese, or plain low-fat yogurt. Low-sodium or reduced-sodium cheeses. Fats and Oils  Tub margarines without trans fats. Light or reduced-fat mayonnaise and salad dressings (reduced sodium). Avocado. Safflower, olive, or canola oils. Natural peanut or almond butter. Other  Unsalted popcorn and pretzels. The items listed above may not be a complete list of recommended foods or beverages. Contact your dietitian for more options.  What foods are not recommended? Grains  White bread. White pasta. White rice. Refined cornbread. Bagels and croissants. Crackers that contain  trans fat. Vegetables  Creamed or fried vegetables. Vegetables in a cheese sauce. Regular canned vegetables. Regular canned tomato sauce and paste. Regular tomato and vegetable juices. Fruits  Canned fruit  in light or heavy syrup. Fruit juice. Meat and Other Protein Products  Fatty cuts of meat. Ribs, chicken wings, bacon, sausage, bologna, salami, chitterlings, fatback, hot dogs, bratwurst, and packaged luncheon meats. Salted nuts and seeds. Canned beans with salt. Dairy  Whole or 2% milk, cream, half-and-half, and cream cheese. Whole-fat or sweetened yogurt. Full-fat cheeses or blue cheese. Nondairy creamers and whipped toppings. Processed cheese, cheese spreads, or cheese curds. Condiments  Onion and garlic salt, seasoned salt, table salt, and sea salt. Canned and packaged gravies. Worcestershire sauce. Tartar sauce. Barbecue sauce. Teriyaki sauce. Soy sauce, including reduced sodium. Steak sauce. Fish sauce. Oyster sauce. Cocktail sauce. Horseradish. Ketchup and mustard. Meat flavorings and tenderizers. Bouillon cubes. Hot sauce. Tabasco sauce. Marinades. Taco seasonings. Relishes. Fats and Oils  Butter, stick margarine, lard, shortening, ghee, and bacon fat. Coconut, palm kernel, or palm oils. Regular salad dressings. Other  Pickles and olives. Salted popcorn and pretzels. The items listed above may not be a complete list of foods and beverages to avoid. Contact your dietitian for more information.  Where can I find more information? National Heart, Lung, and Blood Institute: CablePromo.itwww.nhlbi.nih.gov/health/health-topics/topics/dash/ This information is not intended to replace advice given to you by your health care provider. Make sure you discuss any questions you have with your health care provider. Document Released: 04/15/2011 Document Revised: 10/02/2015 Document Reviewed: 02/28/2013 Elsevier Interactive Patient Education  2017 [employer]

## 2016-05-07 NOTE — Progress Notes (Signed)
   BP 140/88   Pulse 88   Temp 98.2 F (36.8 C) (Oral)   Wt 248 lb (112.5 kg)   BMI 34.59 kg/m    CC: HTN f/u Subjective:    Patient ID: Timothy Davies, male    DOB: 09-06-1980, 35 y.o.   MRN: [id number]  HPI: Timothy Davies is a 35 y.o. male presenting on 05/07/2016 for Hypertension (having HA)   Last seen here 08/2014.   HTN - prior on amlodipine 10mg  daily, off over last year. Does not check blood pressures at home. No low blood pressure readings or symptoms of syncope. Denies vision changes, CP/tightness, SOB, leg swelling. Tries to avoid salt. drinking plenty of water. Feeling lightheaded, some blurry vision and headaches. [facility] bp [facility] [employer] over christ[facility] eve - 180/110 - took losartan. Stays active at work.   Quit smoking 10/2015!   Relevant past medical, surgical, family and social history reviewed and updated as indicated. Interim medical history since our last visit reviewed. Allergies and medications reviewed and updated. No current outpatient prescriptions on file prior to visit.   No current facility-administered medications on file prior to visit.     Review of Systems Per HPI unless specifically indicated in ROS section     Objective:    BP 140/88   Pulse 88   Temp 98.2 F (36.8 C) (Oral)   Wt 248 lb (112.5 kg)   BMI 34.59 kg/m   Wt Readings from Last 3 Encounters:  05/07/16 248 lb (112.5 kg)  08/27/14 255 lb 1.9 oz (115.7 kg)  04/20/13 253 lb 8 oz (115 kg)    Physical Exam  Constitutional: He appears well-developed and well-nourished. No distress.  HENT:  Head: Normocephalic and atraumatic.  Mouth/Throat: Oropharynx is clear and moist. No oropharyngeal exudate.  Eyes: Conjunctivae and EOM are normal. Pupils are equal, round, and reactive to light. No scleral icterus.  Neck: Normal range of motion. Neck supple. Carotid bruit is not present.  Cardiovascular: Normal rate, regular rhythm, normal heart sounds and intact distal pulses.   No murmur  heard. Pulmonary/Chest: Breath sounds normal. No respiratory distress. He has no wheezes. He has no rales.  Musculoskeletal: He exhibits no edema.  Skin: Skin is warm and dry. No rash noted.  Vitals reviewed.     Assessment & Plan:   Problem List Items Addressed This Visit    Ex-smoker    Congratulated on smoking cessat over last 6 months.      Hypertension - Primary    Chronic, uncontrolled based on recent bp's. Better control last few days since he's started sister's losartan. Will restart amlodipine 10mg  daily which he tolerated well. Check labs today. Discussed DASH diet as well as aerobic exercise. Reviewed baseline EKG from 2014. RTC 6 mo CPE and f/u HTN. Pt agrees with plan.      Relevant Medications   amLODipine (NORVASC) 10 MG tablet   Other Relevant Orders   Basic metabolic panel   TSH   Microalbumin / creatinine urine ratio    Other Visit Diagnoses    Need for influenza vaccination       Relevant Orders   Flu Vaccine QUAD 36+ mos PF IM (Fluarix & Fluzone Quad PF)       Follow up plan: No Follow-up on file.  Eustaquio BoydenJavier Tranae Laramie, MD

## 2016-05-07 NOTE — Assessment & Plan Note (Signed)
Chronic, uncontrolled based on recent bp's. Better control last few days since he's started sister's losartan. Will restart amlodipine 10mg  daily which he tolerated well. Check labs today. Discussed DASH diet as well as aerobic exercise. Reviewed baseline EKG from 2014. RTC 6 mo CPE and f/u HTN. Pt agrees with plan.

## 2016-05-07 NOTE — Assessment & Plan Note (Signed)
Congratulated on smoking cessat over last 6 months.

## 2016-05-07 NOTE — Progress Notes (Signed)
Pre visit review using our clinic review tool, if applicable. No additional management support is needed unless otherwise documented below in the visit note. 

## 2016-05-11 ENCOUNTER — Encounter: Payer: Self-pay | Admitting: *Deleted

## 2016-10-27 ENCOUNTER — Other Ambulatory Visit: Payer: Self-pay | Admitting: Family Medicine

## 2016-10-27 DIAGNOSIS — I10 Essential (primary) hypertension: Secondary | ICD-10-CM

## 2016-10-29 ENCOUNTER — Other Ambulatory Visit (INDEPENDENT_AMBULATORY_CARE_PROVIDER_SITE_OTHER): Payer: BLUE CROSS/BLUE SHIELD

## 2016-10-29 DIAGNOSIS — I10 Essential (primary) hypertension: Secondary | ICD-10-CM | POA: Diagnosis not present

## 2016-10-29 LAB — BASIC METABOLIC PANEL
BUN: 20 mg/dL (ref 6–23)
CO2: 24 mEq/L (ref 19–32)
Calcium: 9.7 mg/dL (ref 8.4–10.5)
Chloride: 102 mEq/L (ref 96–112)
Creatinine, Ser: 1.15 mg/dL (ref 0.40–1.50)
GFR: 92.37 mL/min (ref >60.00)
Glucose, Bld: 97 mg/dL (ref 70–99)
Potassium: 3.9 mEq/L (ref 3.5–5.1)
Sodium: 136 mEq/L (ref 135–145)

## 2016-10-29 LAB — MICROALBUMIN / CREATININE URINE RATIO
Creatinine,U: 263.2 mg/dL
Microalb Creat Ratio: 1.5 mg/g (ref 0.0–30.0)
Microalb, Ur: 3.9 mg/dL — ABNORMAL HIGH (ref 0.0–1.9)

## 2016-10-29 LAB — LIPID PANEL
Cholesterol: 230 mg/dL — ABNORMAL HIGH (ref 0–200)
HDL: 63.9 mg/dL (ref >39.00)
LDL Cholesterol: 146 mg/dL — ABNORMAL HIGH (ref 0–99)
NonHDL: 166.47
Total CHOL/HDL Ratio: 4
Triglycerides: 102 mg/dL (ref 0.0–149.0)
VLDL: 20.4 mg/dL (ref 0.0–40.0)

## 2016-11-05 ENCOUNTER — Encounter: Payer: Self-pay | Admitting: Family Medicine

## 2016-11-05 ENCOUNTER — Ambulatory Visit (INDEPENDENT_AMBULATORY_CARE_PROVIDER_SITE_OTHER): Payer: BLUE CROSS/BLUE SHIELD | Admitting: Family Medicine

## 2016-11-05 VITALS — BP 120/80 | HR 75 | Ht 71.0 in | Wt 243.0 lb

## 2016-11-05 DIAGNOSIS — E785 Hyperlipidemia, unspecified: Secondary | ICD-10-CM | POA: Insufficient documentation

## 2016-11-05 DIAGNOSIS — E669 Obesity, unspecified: Secondary | ICD-10-CM

## 2016-11-05 DIAGNOSIS — Z6835 Body mass index (BMI) 35.0-35.9, adult: Secondary | ICD-10-CM | POA: Insufficient documentation

## 2016-11-05 DIAGNOSIS — Z87891 Personal history of nicotine dependence: Secondary | ICD-10-CM

## 2016-11-05 DIAGNOSIS — Z0000 Encounter for general adult medical examination without abnormal findings: Secondary | ICD-10-CM | POA: Diagnosis not present

## 2016-11-05 DIAGNOSIS — I10 Essential (primary) hypertension: Secondary | ICD-10-CM

## 2016-11-05 DIAGNOSIS — E7800 Pure hypercholesterolemia, unspecified: Secondary | ICD-10-CM | POA: Diagnosis not present

## 2016-11-05 MED ORDER — AMLODIPINE BESYLATE 10 MG PO TABS: 10.0000 mg | ORAL_TABLET | Freq: Every day | ORAL | 3 refills | Status: DC

## 2016-11-05 NOTE — Assessment & Plan Note (Signed)
Chronic, stable. Continue current regimen. 

## 2016-11-05 NOTE — Assessment & Plan Note (Signed)
Discussed healthy diet and lifestyle 

## 2016-11-05 NOTE — Patient Instructions (Addendum)
Blood pressure is great! Continue medicine. Work on low cholesterol diet.  Return as needed or in 1 year for next physical  Health Maintenance, Male A healthy lifestyle and preventive care is important for your health and wellness. Ask your health care provider about what schedule of regular examinations is right for you. What should I know about weight and diet? Eat a Healthy Diet  Eat plenty of vegetables, fruits, whole grains, low-fat dairy products, and lean protein.  Do not eat a lot of foods high in solid fats, added sugars, or salt.  Maintain a Healthy Weight Regular exercise can help you achieve or maintain a healthy weight. You should:  Do at least 150 minutes of exercise each week. The exercise should increase your heart rate and make you sweat (moderate-intensity exercise).  Do strength-training exercises at least twice a week.  Watch Your Levels of Cholesterol and Blood Lipids  Have your blood tested for lipids and cholesterol every 5 years starting at 36 years of age. If you are at high risk for heart disease, you should start having your blood tested when you are 36 years old. You may need to have your cholesterol levels checked more often if: ? Your lipid or cholesterol levels are high. ? You are older than 36 years of age. ? You are at high risk for heart disease.  What should I know about cancer screening? Many types of cancers can be detected early and may often be prevented. Lung Cancer  You should be screened every year for lung cancer if: ? You are a current smoker who has smoked for at least 30 years. ? You are a former smoker who has quit within the past 15 years.  Talk to your health care provider about your screening options, when you should start screening, and how often you should be screened.  Colorectal Cancer  Routine colorectal cancer screening usually begins at 36 years of age and should be repeated every 5-10 years until you are 36 years old.  You may need to be screened more often if early forms of precancerous polyps or small growths are found. Your health care provider may recommend screening at an earlier age if you have risk factors for colon cancer.  Your health care provider may recommend using home test kits to check for hidden blood in the stool.  A small camera at the end of a tube can be used to examine your colon (sigmoidoscopy or colonoscopy). This checks for the earliest forms of colorectal cancer.  Prostate and Testicular Cancer  Depending on your age and overall health, your health care provider may do certain tests to screen for prostate and testicular cancer.  Talk to your health care provider about any symptoms or concerns you have about testicular or prostate cancer.  Skin Cancer  Check your skin from head to toe regularly.  Tell your health care provider about any new moles or changes in moles, especially if: ? There is a change in a mole's size, shape, or color. ? You have a mole that is larger than a pencil eraser.  Always use sunscreen. Apply sunscreen liberally and repeat throughout the day.  Protect yourself by wearing long sleeves, pants, a wide-brimmed hat, and sunglasses when outside.  What should I know about heart disease, diabetes, and high blood pressure?  If you are 6918-36 years of age, have your blood pressure checked every 3-5 years. If you are 36 years of age or older, have your blood  pressure checked every year. You should have your blood pressure measured twice-once when you are at a hospital or clinic, and once when you are not at a hospital or clinic. Record the average of the two measurements. To check your blood pressure when you are not at a hospital or clinic, you can use: ? An automated blood pressure machine at a pharmacy. ? A home blood pressure monitor.  Talk to your health care provider about your target blood pressure.  If you are between 25-50 years old, ask your health  care provider if you should take aspirin to prevent heart disease.  Have regular diabetes screenings by checking your fasting blood sugar level. ? If you are at a normal weight and have a low risk for diabetes, have this test once every three years after the age of 53. ? If you are overweight and have a high risk for diabetes, consider being tested at a younger age or more often.  A one-time screening for abdominal aortic aneurysm (AAA) by ultrasound is recommended for men aged 17-75 years who are current or former smokers. What should I know about preventing infection? Hepatitis B If you have a higher risk for hepatitis B, you should be screened for this virus. Talk with your health care provider to find out if you are at risk for hepatitis B infection. Hepatitis C Blood testing is recommended for:  Everyone born from [birth year] through 1965.  Anyone with known risk factors for hepatitis C.  Sexually Transmitted Diseases (STDs)  You should be screened each year for STDs including gonorrhea and chlamydia if: ? You are sexually active and are younger than 36 years of age. ? You are older than 35 years of age and your health care provider tells you that you are at risk for this type of infection. ? Your sexual activity has changed since you were last screened and you are at an increased risk for chlamydia or gonorrhea. Ask your health care provider if you are at risk.  Talk with your health care provider about whether you are at high risk of being infected with HIV. Your health care provider may recommend a prescription medicine to help prevent HIV infection.  What else can I do?  Schedule regular health, dental, and eye exams.  Stay current with your vaccines (immunizations).  Do not use any tobacco products, such as cigarettes, chewing tobacco, and e-cigarettes. If you need help quitting, ask your health care provider.  Limit alcohol intake to no more than 2 drinks per day. One drink  equals 12 ounces of beer, 5 ounces of wine, or 1 ounces of hard liquor.  Do not use street drugs.  Do not share needles.  Ask your health care provider for help if you need support or information about quitting drugs.  Tell your health care provider if you often feel depressed.  Tell your health care provider if you have ever been abused or do not feel safe at home. This information is not intended to replace advice given to you by your health care provider. Make sure you discuss any questions you have with your health care provider. Document Released: 10/23/2007 Document Revised: 12/24/2015 Document Reviewed: 01/28/2015 Elsevier Interactive Patient Education  [employer].

## 2016-11-05 NOTE — Assessment & Plan Note (Signed)
Preventative protocols reviewed and updated unless pt declined. Discussed healthy diet and lifestyle.  

## 2016-11-05 NOTE — Progress Notes (Signed)
Pre visit review using our clinic review tool, if applicable. No additional management support is needed unless otherwise documented below in the visit note. 

## 2016-11-05 NOTE — Progress Notes (Signed)
BP 120/80   Pulse 75   Ht 5\' 11"  (1.803 m)   Wt 243 lb (110.2 kg)   SpO2 95%   BMI 33.89 kg/m    CC: CPE Subjective:    Patient ID: Timothy Davies, male    DOB: 31-May-1980, 36 y.o.   MRN: [id number]  HPI: Timothy Davies is a 36 y.o. male presenting on 11/05/2016 for Annual Exam   Preventative: Flu shot yearly Tdap 2014 Seat belt use discussed Sunscreen use discussed. No changing moles on skin. Ex-smoker - quit 10/2015 Alcohol - 3-4 beers a few times a week  Caffeine - 1 pot of coffee/day.  Lives with wife and 2 children Occupation: Production assistant, radio Editor, commissioning) Edu: college Activity: [employer] 3-4 nights/wk Diet: good water, fruits/vegetables daily, avoids greasy food and fast foods  Relevant past medical, surgical, family and social history reviewed and updated as indicated. Interim medical history since our last visit reviewed. Allergies and medications reviewed and updated. Outpatient Medications Prior to Visit  Medication Sig Dispense Refill  . amLODipine (NORVASC) 10 MG tablet Take 1 tablet (10 mg total) by mouth daily. 90 tablet 3   No facility-administered medications prior to visit.      Per HPI unless specifically indicated in ROS section below Review of Systems  Constitutional: Negative for activity change, appetite change, chills, fatigue, fever and unexpected weight change.  HENT: Negative for hearing loss.   Eyes: Negative for visual disturbance.  Respiratory: Negative for cough, chest tightness, shortness of breath and wheezing.   Cardiovascular: Negative for chest pain, palpitations and leg swelling.  Gastrointestinal: Negative for abdominal distention, abdominal pain, blood in stool, constipation, diarrhea, nausea and vomiting.  Genitourinary: Negative for difficulty urinating and hematuria.  Musculoskeletal: Negative for arthralgias, myalgias and neck pain.  Skin: Negative for rash.  Neurological: Negative for dizziness, seizures,  syncope and headaches.  Hematological: Negative for adenopathy. Does not bruise/bleed easily.  Psychiatric/Behavioral: Negative for dysphoric mood. The patient is not nervous/anxious.        Objective:    BP 120/80   Pulse 75   Ht 5\' 11"  (1.803 m)   Wt 243 lb (110.2 kg)   SpO2 95%   BMI 33.89 kg/m   Wt Readings from Last 3 Encounters:  11/05/16 243 lb (110.2 kg)  05/07/16 248 lb (112.5 kg)  08/27/14 255 lb 1.9 oz (115.7 kg)    Physical Exam  Constitutional: He is oriented to person, place, and time. He appears well-developed and well-nourished. No distress.  HENT:  Head: Normocephalic and atraumatic.  Right Ear: Hearing, tympanic membrane, external ear and ear canal normal.  Left Ear: Hearing, tympanic membrane, external ear and ear canal normal.  Nose: Nose normal.  Mouth/Throat: Uvula is midline, oropharynx is clear and moist and mucous membranes are normal. No oropharyngeal exudate, posterior oropharyngeal edema or posterior oropharyngeal erythema.  Eyes: Conjunctivae and EOM are normal. Pupils are equal, round, and reactive to light. No scleral icterus.  Neck: Normal range of motion. Neck supple. No thyromegaly present.  Cardiovascular: Normal rate, regular rhythm, normal heart sounds and intact distal pulses.   No murmur heard. Pulses:      Radial pulses are 2+ on the right side, and 2+ on the left side.  Pulmonary/Chest: Effort normal and breath sounds normal. No respiratory distress. He has no wheezes. He has no rales.  Abdominal: Soft. Bowel sounds are normal. He exhibits no distension and no mass. There is no tenderness. There is no rebound  and no guarding.  Musculoskeletal: Normal range of motion. He exhibits no edema.  Lymphadenopathy:    He has no cervical adenopathy.  Neurological: He is alert and oriented to person, place, and time.  CN grossly intact, station and gait intact  Skin: Skin is warm and dry. No rash noted.  Psychiatric: He has a normal mood and  affect. His behavior is normal. Judgment and thought content normal.  Nursing note and vitals reviewed.  Results for orders placed or performed in visit on 10/29/16  Lipid panel  Result Value Ref Range   Cholesterol 230 (H) 0 - 200 mg/dL   Triglycerides 952.8102.0 0.0 - 149.0 mg/dL   HDL 41.3263.90 >44.01>39.00 mg/dL   VLDL 02.720.4 0.0 - 25.340.0 mg/dL   LDL Cholesterol 664146 (H) 0 - 99 mg/dL   Total CHOL/HDL Ratio 4    NonHDL 166.47   Basic metabolic panel  Result Value Ref Range   Sodium 136 135 - 145 mEq/L   Potassium 3.9 3.5 - 5.1 mEq/L   Chloride 102 96 - 112 mEq/L   CO2 24 19 - 32 mEq/L   Glucose, Bld 97 70 - 99 mg/dL   BUN 20 6 - 23 mg/dL   Creatinine, Ser 4.031.15 0.40 - 1.50 mg/dL   Calcium 9.7 8.4 - 47.410.5 mg/dL   GFR 25.9592.37 >63.87>60.00 mL/min  Microalbumin / creatinine urine ratio  Result Value Ref Range   Microalb, Ur 3.9 (H) 0.0 - 1.9 mg/dL   Creatinine,U 564.3263.2 mg/dL   Microalb Creat Ratio 1.5 0.0 - 30.0 mg/g      Assessment & Plan:   Problem List Items Addressed This Visit    Ex-smoker    Congratulated on abstinence.       Health maintenance examination - Primary    Preventative protocols reviewed and updated unless pt declined. Discussed healthy diet and lifestyle.       HLD (hyperlipidemia)    rec low chol diet, handout provided.       Relevant Medications   amLODipine (NORVASC) 10 MG tablet   Hypertension    Chronic, stable. Continue current regimen.       Relevant Medications   amLODipine (NORVASC) 10 MG tablet   Obesity, Class I, BMI 30-34.9    Discussed healthy diet and lifestyle.           Follow up plan: Return in about 1 year (around 11/05/2017) for annual exam, prior fasting for blood work.  Eustaquio BoydenJavier Brittini Brubeck, MD

## 2016-11-05 NOTE — Assessment & Plan Note (Signed)
rec low chol diet, handout provided.

## 2016-11-05 NOTE — Assessment & Plan Note (Signed)
Congratulated on abstinence 

## 2017-11-09 ENCOUNTER — Other Ambulatory Visit: Payer: Self-pay | Admitting: Family Medicine

## 2017-11-17 ENCOUNTER — Other Ambulatory Visit: Payer: BLUE CROSS/BLUE SHIELD

## 2017-11-17 ENCOUNTER — Other Ambulatory Visit: Payer: Self-pay | Admitting: Family Medicine

## 2017-11-17 DIAGNOSIS — F419 Anxiety disorder, unspecified: Secondary | ICD-10-CM | POA: Diagnosis not present

## 2017-11-17 DIAGNOSIS — E7800 Pure hypercholesterolemia, unspecified: Secondary | ICD-10-CM

## 2017-11-18 ENCOUNTER — Encounter: Payer: Self-pay | Admitting: Family Medicine

## 2017-11-18 ENCOUNTER — Ambulatory Visit (INDEPENDENT_AMBULATORY_CARE_PROVIDER_SITE_OTHER): Payer: BLUE CROSS/BLUE SHIELD | Admitting: Family Medicine

## 2017-11-18 VITALS — BP 120/72 | HR 73 | Temp 98.1°F | Ht 70.5 in | Wt 234.8 lb

## 2017-11-18 DIAGNOSIS — E669 Obesity, unspecified: Secondary | ICD-10-CM

## 2017-11-18 DIAGNOSIS — I10 Essential (primary) hypertension: Secondary | ICD-10-CM | POA: Diagnosis not present

## 2017-11-18 DIAGNOSIS — Z0000 Encounter for general adult medical examination without abnormal findings: Secondary | ICD-10-CM | POA: Diagnosis not present

## 2017-11-18 DIAGNOSIS — E7800 Pure hypercholesterolemia, unspecified: Secondary | ICD-10-CM | POA: Diagnosis not present

## 2017-11-18 LAB — COMPREHENSIVE METABOLIC PANEL
ALT: 22 U/L (ref 0–53)
AST: 17 U/L (ref 0–37)
Albumin: 4.6 g/dL (ref 3.5–5.2)
Alkaline Phosphatase: 37 U/L — ABNORMAL LOW (ref 39–117)
BUN: 12 mg/dL (ref 6–23)
CO2: 27 mEq/L (ref 19–32)
Calcium: 9.3 mg/dL (ref 8.4–10.5)
Chloride: 102 mEq/L (ref 96–112)
Creatinine, Ser: 1.01 mg/dL (ref 0.40–1.50)
GFR: 106.68 mL/min (ref >60.00)
Glucose, Bld: 83 mg/dL (ref 70–99)
Potassium: 3.9 mEq/L (ref 3.5–5.1)
Sodium: 136 mEq/L (ref 135–145)
Total Bilirubin: 0.6 mg/dL (ref 0.2–1.2)
Total Protein: 7.9 g/dL (ref 6.0–8.3)

## 2017-11-18 LAB — LIPID PANEL
Cholesterol: 210 mg/dL — ABNORMAL HIGH (ref 0–200)
HDL: 70.8 mg/dL (ref >39.00)
LDL Cholesterol: 116 mg/dL — ABNORMAL HIGH (ref 0–99)
NonHDL: 139.56
Total CHOL/HDL Ratio: 3
Triglycerides: 119 mg/dL (ref 0.0–149.0)
VLDL: 23.8 mg/dL (ref 0.0–40.0)

## 2017-11-18 MED ORDER — AMLODIPINE BESYLATE 10 MG PO TABS: 10.0000 mg | ORAL_TABLET | Freq: Every day | ORAL | 3 refills | Status: DC

## 2017-11-18 NOTE — Assessment & Plan Note (Signed)
Update labs. The ASCVD Risk score Denman George(Goff DC Jr., et al., 2013) failed to calculate for the following reasons:   The 2013 ASCVD risk score is only valid for ages 5740 to 7979

## 2017-11-18 NOTE — Assessment & Plan Note (Signed)
Encouraged healthy diet and lifestyle. Overall healthy.

## 2017-11-18 NOTE — Progress Notes (Signed)
BP 120/72 (BP Location: Left Arm, Patient Position: Sitting, Cuff Size: Large)   Pulse 73   Temp 98.1 F (36.7 C) (Oral)   Ht 5' 10.5" (1.791 m)   Wt 234 lb 12 oz (106.5 kg)   SpO2 99%   BMI 33.21 kg/m     Visual Acuity Screening   Right eye Left eye Both eyes  Without correction: 20/30 20/30 20/20   With correction:       CC: CPE Subjective:    Patient ID: Timothy Davies, male    DOB: March 10, 1981, 37 y.o.   MRN: [id number]  HPI: EJAY LASHLEY is a 37 y.o. male presenting on 11/18/2017 for Annual Exam   Preventative: Flu shot yearly Tdap 2014 Seat belt use discussed Sunscreen use discussed. No changing moles on skin. Ex-smoker - quit 10/2015 Alcohol - a few beers a week Dentist - Q6 mo Eye exam - yearly   Caffeine - 1 pot of coffee/day.  Lives with wife and 2 children Occupation: Production assistant, radio Editor, commissioning) IT consultant Edu: college Activity: [employer] 3-4 nights/wk Diet: good water, fruits/vegetables daily, avoids greasy food and fast foods  Relevant past medical, surgical, family and social history reviewed and updated as indicated. Interim medical history since our last visit reviewed. Allergies and medications reviewed and updated. Outpatient Medications Prior to Visit  Medication Sig Dispense Refill  . amLODipine (NORVASC) 10 MG tablet TAKE 1 TABLET BY MOUTH EVERY DAY 30 tablet 0   No facility-administered medications prior to visit.      Per HPI unless specifically indicated in ROS section below Review of Systems  Constitutional: Negative for activity change, appetite change, chills, fatigue, fever and unexpected weight change.  HENT: Negative for hearing loss.   Eyes: Negative for visual disturbance.  Respiratory: Negative for cough, chest tightness, shortness of breath and wheezing.   Cardiovascular: Negative for chest pain, palpitations and leg swelling.  Gastrointestinal: Negative for abdominal distention, abdominal pain, blood in  stool, constipation, diarrhea, nausea and vomiting.  Genitourinary: Negative for difficulty urinating and hematuria.  Musculoskeletal: Negative for arthralgias, myalgias and neck pain.  Skin: Negative for rash.  Neurological: Negative for dizziness, seizures, syncope and headaches.  Hematological: Negative for adenopathy. Does not bruise/bleed easily.  Psychiatric/Behavioral: Negative for dysphoric mood. The patient is not nervous/anxious.        Objective:    BP 120/72 (BP Location: Left Arm, Patient Position: Sitting, Cuff Size: Large)   Pulse 73   Temp 98.1 F (36.7 C) (Oral)   Ht 5' 10.5" (1.791 m)   Wt 234 lb 12 oz (106.5 kg)   SpO2 99%   BMI 33.21 kg/m   Wt Readings from Last 3 Encounters:  11/18/17 234 lb 12 oz (106.5 kg)  11/05/16 243 lb (110.2 kg)  05/07/16 248 lb (112.5 kg)    Physical Exam  Constitutional: He is oriented to person, place, and time. He appears well-developed and well-nourished. No distress.  HENT:  Head: Normocephalic and atraumatic.  Right Ear: Hearing, tympanic membrane, external ear and ear canal normal.  Left Ear: Hearing, tympanic membrane, external ear and ear canal normal.  Nose: Nose normal.  Mouth/Throat: Uvula is midline, oropharynx is clear and moist and mucous membranes are normal. No oropharyngeal exudate, posterior oropharyngeal edema or posterior oropharyngeal erythema.  Eyes: Pupils are equal, round, and reactive to light. Conjunctivae and EOM are normal. No scleral icterus.  Neck: Normal range of motion. Neck supple. No thyromegaly present.  Cardiovascular: Normal rate, regular  rhythm, normal heart sounds and intact distal pulses.  No murmur heard. Pulses:      Radial pulses are 2+ on the right side, and 2+ on the left side.  Pulmonary/Chest: Effort normal and breath sounds normal. No respiratory distress. He has no wheezes. He has no rales.  Abdominal: Soft. Bowel sounds are normal. He exhibits no distension and no mass. There is  no tenderness. There is no rebound and no guarding.  Musculoskeletal: Normal range of motion. He exhibits no edema.  Lymphadenopathy:    He has no cervical adenopathy.  Neurological: He is alert and oriented to person, place, and time.  CN grossly intact, station and gait intact  Skin: Skin is warm and dry. No rash noted.  Psychiatric: He has a normal mood and affect. His behavior is normal. Judgment and thought content normal.  Nursing note and vitals reviewed.  Results for orders placed or performed in visit on 10/29/16  Lipid panel  Result Value Ref Range   Cholesterol 230 (H) 0 - 200 mg/dL   Triglycerides 960.4102.0 0.0 - 149.0 mg/dL   HDL 54.0963.90 >81.19>39.00 mg/dL   VLDL 14.720.4 0.0 - 82.940.0 mg/dL   LDL Cholesterol 562146 (H) 0 - 99 mg/dL   Total CHOL/HDL Ratio 4    NonHDL 166.47   Basic metabolic panel  Result Value Ref Range   Sodium 136 135 - 145 mEq/L   Potassium 3.9 3.5 - 5.1 mEq/L   Chloride 102 96 - 112 mEq/L   CO2 24 19 - 32 mEq/L   Glucose, Bld 97 70 - 99 mg/dL   BUN 20 6 - 23 mg/dL   Creatinine, Ser 1.301.15 0.40 - 1.50 mg/dL   Calcium 9.7 8.4 - 86.510.5 mg/dL   GFR 78.4692.37 >96.29>60.00 mL/min  Microalbumin / creatinine urine ratio  Result Value Ref Range   Microalb, Ur 3.9 (H) 0.0 - 1.9 mg/dL   Creatinine,U 528.4263.2 mg/dL   Microalb Creat Ratio 1.5 0.0 - 30.0 mg/g      Assessment & Plan:   Problem List Items Addressed This Visit    Obesity, Class I, BMI 30-34.9    Encouraged healthy diet and lifestyle. Overall healthy.       Hypertension    Chronic, stable. Continue current regimen.       Relevant Medications   amLODipine (NORVASC) 10 MG tablet   HLD (hyperlipidemia)    Update labs. The ASCVD Risk score Denman George(Goff DC Jr., et al., 2013) failed to calculate for the following reasons:   The 2013 ASCVD risk score is only valid for ages 4340 to 6879       Relevant Medications   amLODipine (NORVASC) 10 MG tablet   Other Relevant Orders   Lipid panel   Comprehensive metabolic panel   Health  maintenance examination - Primary    Preventative protocols reviewed and updated unless pt declined. Discussed healthy diet and lifestyle.           Meds ordered this encounter  Medications  . amLODipine (NORVASC) 10 MG tablet    Sig: Take 1 tablet (10 mg total) by mouth daily.    Dispense:  90 tablet    Refill:  3   Orders Placed This Encounter  Procedures  . Lipid panel  . Comprehensive metabolic panel    Follow up plan: Return in about 1 year (around 11/19/2018) for annual exam, prior fasting for blood work.  Eustaquio BoydenJavier Mariell Nester, MD

## 2017-11-18 NOTE — Assessment & Plan Note (Signed)
Chronic, stable. Continue current regimen. 

## 2017-11-18 NOTE — Assessment & Plan Note (Signed)
Preventative protocols reviewed and updated unless pt declined. Discussed healthy diet and lifestyle.  

## 2017-11-18 NOTE — Patient Instructions (Addendum)
Labs today. Vision screen today.  You are doing well today Continue healthy diet choices and active lifestyle. Return as needed or in 1 year for next physical.   Health Maintenance, Male A healthy lifestyle and preventive care is important for your health and wellness. Ask your health care provider about what schedule of regular examinations is right for you. What should I know about weight and diet? Eat a Healthy Diet  Eat plenty of vegetables, fruits, whole grains, low-fat dairy products, and lean protein.  Do not eat a lot of foods high in solid fats, added sugars, or salt.  Maintain a Healthy Weight Regular exercise can help you achieve or maintain a healthy weight. You should:  Do at least 150 minutes of exercise each week. The exercise should increase your heart rate and make you sweat (moderate-intensity exercise).  Do strength-training exercises at least twice a week.  Watch Your Levels of Cholesterol and Blood Lipids  Have your blood tested for lipids and cholesterol every 5 years starting at 37 years of age. If you are at high risk for heart disease, you should start having your blood tested when you are 37 years old. You may need to have your cholesterol levels checked more often if: ? Your lipid or cholesterol levels are high. ? You are older than 37 years of age. ? You are at high risk for heart disease.  What should I know about cancer screening? Many types of cancers can be detected early and may often be prevented. Lung Cancer  You should be screened every year for lung cancer if: ? You are a current smoker who has smoked for at least 30 years. ? You are a former smoker who has quit within the past 15 years.  Talk to your health care provider about your screening options, when you should start screening, and how often you should be screened.  Colorectal Cancer  Routine colorectal cancer screening usually begins at 37 years of age and should be repeated every  5-10 years until you are 37 years old. You may need to be screened more often if early forms of precancerous polyps or small growths are found. Your health care provider may recommend screening at an earlier age if you have risk factors for colon cancer.  Your health care provider may recommend using home test kits to check for hidden blood in the stool.  A small camera at the end of a tube can be used to examine your colon (sigmoidoscopy or colonoscopy). This checks for the earliest forms of colorectal cancer.  Prostate and Testicular Cancer  Depending on your age and overall health, your health care provider may do certain tests to screen for prostate and testicular cancer.  Talk to your health care provider about any symptoms or concerns you have about testicular or prostate cancer.  Skin Cancer  Check your skin from head to toe regularly.  Tell your health care provider about any new moles or changes in moles, especially if: ? There is a change in a mole's size, shape, or color. ? You have a mole that is larger than a pencil eraser.  Always use sunscreen. Apply sunscreen liberally and repeat throughout the day.  Protect yourself by wearing long sleeves, pants, a wide-brimmed hat, and sunglasses when outside.  What should I know about heart disease, diabetes, and high blood pressure?  If you are 7318-37 years of age, have your blood pressure checked every 3-5 years. If you are 40 years  of age or older, have your blood pressure checked every year. You should have your blood pressure measured twice-once when you are at a hospital or clinic, and once when you are not at a hospital or clinic. Record the average of the two measurements. To check your blood pressure when you are not at a hospital or clinic, you can use: ? An automated blood pressure machine at a pharmacy. ? A home blood pressure monitor.  Talk to your health care provider about your target blood pressure.  If you are  between 28-42 years old, ask your health care provider if you should take aspirin to prevent heart disease.  Have regular diabetes screenings by checking your fasting blood sugar level. ? If you are at a normal weight and have a low risk for diabetes, have this test once every three years after the age of 51. ? If you are overweight and have a high risk for diabetes, consider being tested at a younger age or more often.  A one-time screening for abdominal aortic aneurysm (AAA) by ultrasound is recommended for men aged 81-75 years who are current or former smokers. What should I know about preventing infection? Hepatitis B If you have a higher risk for hepatitis B, you should be screened for this virus. Talk with your health care provider to find out if you are at risk for hepatitis B infection. Hepatitis C Blood testing is recommended for:  Everyone born from [birth year] through 1965.  Anyone with known risk factors for hepatitis C.  Sexually Transmitted Diseases (STDs)  You should be screened each year for STDs including gonorrhea and chlamydia if: ? You are sexually active and are younger than 37 years of age. ? You are older than 37 years of age and your health care provider tells you that you are at risk for this type of infection. ? Your sexual activity has changed since you were last screened and you are at an increased risk for chlamydia or gonorrhea. Ask your health care provider if you are at risk.  Talk with your health care provider about whether you are at high risk of being infected with HIV. Your health care provider may recommend a prescription medicine to help prevent HIV infection.  What else can I do?  Schedule regular health, dental, and eye exams.  Stay current with your vaccines (immunizations).  Do not use any tobacco products, such as cigarettes, chewing tobacco, and e-cigarettes. If you need help quitting, ask your health care provider.  Limit alcohol intake to no  more than 2 drinks per day. One drink equals 12 ounces of beer, 5 ounces of wine, or 1 ounces of hard liquor.  Do not use street drugs.  Do not share needles.  Ask your health care provider for help if you need support or information about quitting drugs.  Tell your health care provider if you often feel depressed.  Tell your health care provider if you have ever been abused or do not feel safe at home. This information is not intended to replace advice given to you by your health care provider. Make sure you discuss any questions you have with your health care provider. Document Released: 10/23/2007 Document Revised: 12/24/2015 Document Reviewed: 01/28/2015 Elsevier Interactive Patient Education  [employer].

## 2017-11-25 DIAGNOSIS — F419 Anxiety disorder, unspecified: Secondary | ICD-10-CM | POA: Diagnosis not present

## 2017-11-28 DIAGNOSIS — F419 Anxiety disorder, unspecified: Secondary | ICD-10-CM | POA: Diagnosis not present

## 2018-01-07 ENCOUNTER — Other Ambulatory Visit: Payer: Self-pay | Admitting: Family Medicine

## 2018-11-16 ENCOUNTER — Telehealth: Payer: Self-pay

## 2018-11-16 NOTE — Telephone Encounter (Signed)
Left message to call clinic, needs COVID screen and back door lab info   

## 2018-11-20 ENCOUNTER — Other Ambulatory Visit: Payer: Self-pay | Admitting: Family Medicine

## 2018-11-20 ENCOUNTER — Other Ambulatory Visit: Payer: Self-pay

## 2018-11-20 DIAGNOSIS — E7800 Pure hypercholesterolemia, unspecified: Secondary | ICD-10-CM

## 2018-11-20 DIAGNOSIS — I10 Essential (primary) hypertension: Secondary | ICD-10-CM

## 2018-11-23 ENCOUNTER — Encounter: Payer: BLUE CROSS/BLUE SHIELD | Admitting: Family Medicine

## 2018-12-09 ENCOUNTER — Other Ambulatory Visit: Payer: Self-pay | Admitting: Family Medicine

## 2018-12-27 DIAGNOSIS — Z20828 Contact with and (suspected) exposure to other viral communicable diseases: Secondary | ICD-10-CM | POA: Diagnosis not present

## 2019-02-02 ENCOUNTER — Other Ambulatory Visit (INDEPENDENT_AMBULATORY_CARE_PROVIDER_SITE_OTHER): Payer: Self-pay

## 2019-02-02 ENCOUNTER — Other Ambulatory Visit: Payer: Self-pay

## 2019-02-02 DIAGNOSIS — I10 Essential (primary) hypertension: Secondary | ICD-10-CM

## 2019-02-02 DIAGNOSIS — E7800 Pure hypercholesterolemia, unspecified: Secondary | ICD-10-CM

## 2019-02-02 LAB — COMPREHENSIVE METABOLIC PANEL
ALT: 24 U/L (ref 0–53)
AST: 22 U/L (ref 0–37)
Albumin: 4.7 g/dL (ref 3.5–5.2)
Alkaline Phosphatase: 42 U/L (ref 39–117)
BUN: 19 mg/dL (ref 6–23)
CO2: 27 mEq/L (ref 19–32)
Calcium: 9.5 mg/dL (ref 8.4–10.5)
Chloride: 101 mEq/L (ref 96–112)
Creatinine, Ser: 1.37 mg/dL (ref 0.40–1.50)
GFR: 70.15 mL/min (ref >60.00)
Glucose, Bld: 80 mg/dL (ref 70–99)
Potassium: 3.9 mEq/L (ref 3.5–5.1)
Sodium: 137 mEq/L (ref 135–145)
Total Bilirubin: 0.3 mg/dL (ref 0.2–1.2)
Total Protein: 7.7 g/dL (ref 6.0–8.3)

## 2019-02-02 LAB — LIPID PANEL
Cholesterol: 199 mg/dL (ref 0–200)
HDL: 72 mg/dL (ref >39.00)
LDL Cholesterol: 106 mg/dL — ABNORMAL HIGH (ref 0–99)
NonHDL: 127.39
Total CHOL/HDL Ratio: 3
Triglycerides: 109 mg/dL (ref 0.0–149.0)
VLDL: 21.8 mg/dL (ref 0.0–40.0)

## 2019-02-02 LAB — MICROALBUMIN / CREATININE URINE RATIO
Creatinine,U: 130.8 mg/dL
Microalb Creat Ratio: 0.5 mg/g (ref 0.0–30.0)
Microalb, Ur: <0.7 mg/dL (ref 0.0–1.9)

## 2019-02-02 LAB — TSH: TSH: 1.79 u[IU]/mL (ref 0.35–4.50)

## 2019-02-09 ENCOUNTER — Encounter: Payer: Self-pay | Admitting: Family Medicine

## 2019-02-09 DIAGNOSIS — Z0289 Encounter for other administrative examinations: Secondary | ICD-10-CM

## 2019-03-27 ENCOUNTER — Other Ambulatory Visit: Payer: Self-pay | Admitting: Family Medicine

## 2019-03-27 NOTE — Telephone Encounter (Signed)
E-scribed refill.  Pls schedule cpe

## 2019-03-28 NOTE — Telephone Encounter (Signed)
lvm for pt to call back and schedule cpe

## 2019-04-10 ENCOUNTER — Encounter: Payer: Self-pay | Admitting: Family Medicine

## 2019-04-10 NOTE — Telephone Encounter (Signed)
2nd attempt mailed letter 04/10/19

## 2019-04-10 NOTE — Telephone Encounter (Signed)
Pt called back and scheduled cpe for 05/29/19. Did not mail letter.

## 2019-05-29 ENCOUNTER — Encounter: Payer: Self-pay | Admitting: Family Medicine

## 2019-06-15 ENCOUNTER — Encounter: Payer: Self-pay | Admitting: Family Medicine

## 2019-06-21 ENCOUNTER — Encounter: Payer: Self-pay | Admitting: Family Medicine

## 2019-06-21 DIAGNOSIS — Z0289 Encounter for other administrative examinations: Secondary | ICD-10-CM

## 2019-06-25 ENCOUNTER — Other Ambulatory Visit: Payer: Self-pay

## 2019-06-25 ENCOUNTER — Encounter: Payer: Self-pay | Admitting: Family Medicine

## 2019-06-25 ENCOUNTER — Ambulatory Visit (INDEPENDENT_AMBULATORY_CARE_PROVIDER_SITE_OTHER): Payer: 59 | Admitting: Family Medicine

## 2019-06-25 VITALS — BP 132/84 | HR 96 | Temp 96.6°F | Resp 16 | Ht 71.0 in | Wt 248.4 lb

## 2019-06-25 DIAGNOSIS — I10 Essential (primary) hypertension: Secondary | ICD-10-CM | POA: Diagnosis not present

## 2019-06-25 DIAGNOSIS — R195 Other fecal abnormalities: Secondary | ICD-10-CM

## 2019-06-25 DIAGNOSIS — K5732 Diverticulitis of large intestine without perforation or abscess without bleeding: Secondary | ICD-10-CM | POA: Diagnosis not present

## 2019-06-25 DIAGNOSIS — E669 Obesity, unspecified: Secondary | ICD-10-CM

## 2019-06-25 DIAGNOSIS — Z0000 Encounter for general adult medical examination without abnormal findings: Secondary | ICD-10-CM | POA: Diagnosis not present

## 2019-06-25 DIAGNOSIS — E7800 Pure hypercholesterolemia, unspecified: Secondary | ICD-10-CM

## 2019-06-25 MED ORDER — AMLODIPINE BESYLATE 10 MG PO TABS: 10.0000 mg | ORAL_TABLET | Freq: Every day | ORAL | 11 refills | Status: DC

## 2019-06-25 MED ORDER — AMLODIPINE BESYLATE 10 MG PO TABS: 10.0000 mg | ORAL_TABLET | Freq: Every day | ORAL | 4 refills | Status: DC

## 2019-06-25 NOTE — Assessment & Plan Note (Signed)
Loose stools since then.

## 2019-06-25 NOTE — Assessment & Plan Note (Signed)
Predominantly driven by high HDL. No need for statin at this time.

## 2019-06-25 NOTE — Assessment & Plan Note (Signed)
Chronic, stable. Continue current regimen. 

## 2019-06-25 NOTE — Assessment & Plan Note (Signed)
Overall healthy. Reviewed healthy diet and lifestyle choices.

## 2019-06-25 NOTE — Patient Instructions (Signed)
You are doing well today Continue amlodipine. Return as needed or in 1 year for next physical.  Health Maintenance, Male Adopting a healthy lifestyle and getting preventive care are important in promoting health and wellness. Ask your health care provider about:  The right schedule for you to have regular tests and exams.  Things you can do on your own to prevent diseases and keep yourself healthy. What should I know about diet, weight, and exercise? Eat a healthy diet   Eat a diet that includes plenty of vegetables, fruits, low-fat dairy products, and lean protein.  Do not eat a lot of foods that are high in solid fats, added sugars, or sodium. Maintain a healthy weight Body mass index (BMI) is a measurement that can be used to identify possible weight problems. It estimates body fat based on height and weight. Your health care provider can help determine your BMI and help you achieve or maintain a healthy weight. Get regular exercise Get regular exercise. This is one of the most important things you can do for your health. Most adults should:  Exercise for at least 150 minutes each week. The exercise should increase your heart rate and make you sweat (moderate-intensity exercise).  Do strengthening exercises at least twice a week. This is in addition to the moderate-intensity exercise.  Spend less time sitting. Even light physical activity can be beneficial. Watch cholesterol and blood lipids Have your blood tested for lipids and cholesterol at 39 years of age, then have this test every 5 years. You may need to have your cholesterol levels checked more often if:  Your lipid or cholesterol levels are high.  You are older than 39 years of age.  You are at high risk for heart disease. What should I know about cancer screening? Many types of cancers can be detected early and may often be prevented. Depending on your health history and family history, you may need to have cancer  screening at various ages. This may include screening for:  Colorectal cancer.  Prostate cancer.  Skin cancer.  Lung cancer. What should I know about heart disease, diabetes, and high blood pressure? Blood pressure and heart disease  High blood pressure causes heart disease and increases the risk of stroke. This is more likely to develop in people who have high blood pressure readings, are of African descent, or are overweight.  Talk with your health care provider about your target blood pressure readings.  Have your blood pressure checked: ? Every 3-5 years if you are 65-32 years of age. ? Every year if you are 49 years old or older.  If you are between the ages of 11 and 10 and are a current or former smoker, ask your health care provider if you should have a one-time screening for abdominal aortic aneurysm (AAA). Diabetes Have regular diabetes screenings. This checks your fasting blood sugar level. Have the screening done:  Once every three years after age 71 if you are at a normal weight and have a low risk for diabetes.  More often and at a younger age if you are overweight or have a high risk for diabetes. What should I know about preventing infection? Hepatitis B If you have a higher risk for hepatitis B, you should be screened for this virus. Talk with your health care provider to find out if you are at risk for hepatitis B infection. Hepatitis C Blood testing is recommended for:  Everyone born from [birth year] through 1965.  Anyone  with known risk factors for hepatitis C. Sexually transmitted infections (STIs)  You should be screened each year for STIs, including gonorrhea and chlamydia, if: ? You are sexually active and are younger than 39 years of age. ? You are older than 39 years of age and your health care provider tells you that you are at risk for this type of infection. ? Your sexual activity has changed since you were last screened, and you are at increased risk  for chlamydia or gonorrhea. Ask your health care provider if you are at risk.  Ask your health care provider about whether you are at high risk for HIV. Your health care provider may recommend a prescription medicine to help prevent HIV infection. If you choose to take medicine to prevent HIV, you should first get tested for HIV. You should then be tested every 3 months for as long as you are taking the medicine. Follow these instructions at home: Lifestyle  Do not use any products that contain nicotine or tobacco, such as cigarettes, e-cigarettes, and chewing tobacco. If you need help quitting, ask your health care provider.  Do not use street drugs.  Do not share needles.  Ask your health care provider for help if you need support or information about quitting drugs. Alcohol use  Do not drink alcohol if your health care provider tells you not to drink.  If you drink alcohol: ? Limit how much you have to 0-2 drinks a day. ? Be aware of how much alcohol is in your drink. In the [place] one drink equals one 12 oz bottle of beer (355 mL), one 5 oz glass of wine (148 mL), or one 1 oz glass of hard liquor (44 mL). General instructions  Schedule regular health, dental, and eye exams.  Stay current with your vaccines.  Tell your health care provider if: ? You often feel depressed. ? You have ever been abused or do not feel safe at home. Summary  Adopting a healthy lifestyle and getting preventive care are important in promoting health and wellness.  Follow your health care provider's instructions about healthy diet, exercising, and getting tested or screened for diseases.  Follow your health care provider's instructions on monitoring your cholesterol and blood pressure. This information is not intended to replace advice given to you by your health care provider. Make sure you discuss any questions you have with your health care provider. Document Revised: 04/19/2018 Document Reviewed:  04/19/2018 Elsevier Patient Education  2020 [employer].

## 2019-06-25 NOTE — Progress Notes (Signed)
This visit was conducted in person.  BP 132/84   Pulse 96   Temp (!) 96.6 F (35.9 C) (Temporal)   Resp 16   Ht 5\' 11"  (1.803 m)   Wt 248 lb 6.4 oz (112.7 kg)   SpO2 99%   BMI 34.64 kg/m    CC: CPE Subjective:    Patient ID: , male    DOB: [birth year], 39 y.o.   MRN: [id number]  HPI: Timothy Davies is a 39 y.o. male presenting on 06/25/2019 for Annual Exam   Preventative: Flu shot - declines Tdap 2014 Covid vaccine - considering. Seat belt use discussed Sunscreen use discussed. No changing moles on skin. Ex-smoker - quit 10/2015 Alcohol - 2-3 beers/day Dentist - Q6 mo Eye exam - due  Caffeine - 1 pot of coffee/day.  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 Interim medical history since our last visit reviewed. Allergies and medications reviewed and updated. Outpatient Medications Prior to Visit  Medication Sig Dispense Refill  . amLODipine (NORVASC) 10 MG tablet TAKE 1 TABLET BY MOUTH EVERY DAY 30 tablet 9  . amLODipine (NORVASC) 10 MG tablet TAKE 1 TABLET BY MOUTH EVERY DAY 30 tablet 2   No facility-administered medications prior to visit.     Per HPI unless specifically indicated in ROS section below Review of Systems  Constitutional: Negative for activity change, appetite change, chills, fatigue, fever and unexpected weight change.  HENT: Negative for hearing loss.   Eyes: Negative for visual disturbance.  Respiratory: Negative for cough, chest tightness, shortness of breath and wheezing.   Cardiovascular: Negative for chest pain, palpitations and leg swelling.  Gastrointestinal: Negative for abdominal distention, abdominal pain, blood in stool, constipation, diarrhea,  nausea and vomiting.       Loose stools since ileocecectomy 2013  Genitourinary: Negative for difficulty urinating and hematuria.  Musculoskeletal: Negative for arthralgias, myalgias and neck pain.  Skin: Negative for rash.  Neurological: Negative for dizziness, seizures, syncope and headaches.  Hematological: Negative for adenopathy. Does not bruise/bleed easily.  Psychiatric/Behavioral: Negative for dysphoric mood. The patient is not nervous/anxious.    Objective:    BP 132/84   Pulse 96   Temp (!) 96.6 F (35.9 C) (Temporal)   Resp 16   Ht 5\' 11"  (1.803 m)   Wt 248 lb 6.4 oz (112.7 kg)   SpO2 99%   BMI 34.64 kg/m   Wt Readings from Last 3 Encounters:  06/25/19 248 lb 6.4 oz (112.7 kg)  11/18/17 234 lb 12 oz (106.5 kg)  11/05/16 243 lb (110.2 kg)    Physical Exam Vitals and nursing note reviewed.  Constitutional:      General: He is not in acute distress.    Appearance: Normal appearance. He is well-developed. He is not ill-appearing.  HENT:     Head: Normocephalic and atraumatic.     Right Ear: Hearing, tympanic membrane, ear canal and external ear normal.     Left Ear: Hearing, tympanic membrane, ear canal and external ear normal.     Mouth/Throat:     Pharynx: Uvula midline.  Eyes:     General: No scleral icterus.    Extraocular Movements: Extraocular movements intact.     Conjunctiva/sclera: Conjunctivae normal.  Pupils: Pupils are equal, round, and reactive to light.  Cardiovascular:     Rate and Rhythm: Normal rate and regular rhythm.     Pulses: Normal pulses.          Radial pulses are 2+ on the right side and 2+ on the left side.     Heart sounds: Normal heart sounds. No murmur.  Pulmonary:     Effort: Pulmonary effort is normal. No respiratory distress.     Breath sounds: Normal breath sounds. No wheezing, rhonchi or rales.  Abdominal:     General: Abdomen is flat. Bowel sounds are normal. There is no distension.     Palpations: Abdomen is soft.  There is no mass.     Tenderness: There is no abdominal tenderness. There is no guarding or rebound.     Hernia: No hernia is present.  Musculoskeletal:        General: Normal range of motion.     Cervical back: Normal range of motion and neck supple.     Right lower leg: No edema.     Left lower leg: No edema.  Lymphadenopathy:     Cervical: No cervical adenopathy.  Skin:    General: Skin is warm and dry.     Findings: No rash.  Neurological:     General: No focal deficit present.     Mental Status: He is alert and oriented to person, place, and time.     Comments: CN grossly intact, station and gait intact  Psychiatric:        Mood and Affect: Mood normal.        Behavior: Behavior normal.        Thought Content: Thought content normal.        Judgment: Judgment normal.       Results for orders placed or performed in visit on 02/02/19  TSH  Result Value Ref Range   TSH 1.79 0.35 - 4.50 uIU/mL  Microalbumin / creatinine urine ratio  Result Value Ref Range   Microalb, Ur <0.7 0.0 - 1.9 mg/dL   Creatinine,U 093.2 mg/dL   Microalb Creat Ratio 0.5 0.0 - 30.0 mg/g  Comprehensive metabolic panel  Result Value Ref Range   Sodium 137 135 - 145 mEq/L   Potassium 3.9 3.5 - 5.1 mEq/L   Chloride 101 96 - 112 mEq/L   CO2 27 19 - 32 mEq/L   Glucose, Bld 80 70 - 99 mg/dL   BUN 19 6 - 23 mg/dL   Creatinine, Ser 6.71 0.40 - 1.50 mg/dL   Total Bilirubin 0.3 0.2 - 1.2 mg/dL   Alkaline Phosphatase 42 39 - 117 U/L   AST 22 0 - 37 U/L   ALT 24 0 - 53 U/L   Total Protein 7.7 6.0 - 8.3 g/dL   Albumin 4.7 3.5 - 5.2 g/dL   Calcium 9.5 8.4 - 24.5 mg/dL   GFR 80.99 >83.38 mL/min  Lipid panel  Result Value Ref Range   Cholesterol 199 0 - 200 mg/dL   Triglycerides 250.5 0.0 - 149.0 mg/dL   HDL 39.76 >73.41 mg/dL   VLDL 93.7 0.0 - 90.2 mg/dL   LDL Cholesterol 409 (H) 0 - 99 mg/dL   Total CHOL/HDL Ratio 3    NonHDL 127.39    Assessment & Plan:  This visit occurred during the SARS-CoV-2  public health emergency.  Safety protocols were in place, including screening questions prior to the visit, additional usage of staff PPE, and extensive  cleaning of exam room while observing appropriate contact time as indicated for disinfecting solutions.   Problem List Items Addressed This Visit    Obesity, Class I, BMI 30-34.9    Overall healthy. Reviewed healthy diet and lifestyle choices.       Loose stools    Loose stools since ileocecal removal 2013. No blood in stool. No fmhx colon cancer.       Hypertension    Chronic, stable. Continue current regimen.       Relevant Medications   amLODipine (NORVASC) 10 MG tablet   HLD (hyperlipidemia)    Predominantly driven by high HDL. No need for statin at this time.       Relevant Medications   amLODipine (NORVASC) 10 MG tablet   Health maintenance examination - Primary    Preventative protocols reviewed and updated unless pt declined. Discussed healthy diet and lifestyle.       Cecal perforation    Loose stools since then.           Meds ordered this encounter  Medications  . DISCONTD: amLODipine (NORVASC) 10 MG tablet    Sig: Take 1 tablet (10 mg total) by mouth daily.    Dispense:  30 tablet    Refill:  11  . amLODipine (NORVASC) 10 MG tablet    Sig: Take 1 tablet (10 mg total) by mouth daily.    Dispense:  90 tablet    Refill:  4   No orders of the defined types were placed in this encounter.   Patient instructions: You are doing well today Continue amlodipine. Return as needed or in 1 year for next physical.  Follow up plan: Return in about 1 year (around 06/24/2020) for annual exam, prior fasting for blood work.  Ria Bush, MD

## 2019-06-25 NOTE — Assessment & Plan Note (Addendum)
Loose stools since ileocecal removal 2013. No blood in stool. No fmhx colon cancer.

## 2019-06-25 NOTE — Assessment & Plan Note (Signed)
Preventative protocols reviewed and updated unless pt declined. Discussed healthy diet and lifestyle.  

## 2019-08-30 ENCOUNTER — Ambulatory Visit: Payer: 59

## 2019-08-30 ENCOUNTER — Ambulatory Visit: Payer: 59 | Attending: Internal Medicine

## 2019-08-30 DIAGNOSIS — Z23 Encounter for immunization: Secondary | ICD-10-CM

## 2019-08-30 NOTE — Progress Notes (Signed)
   Covid-19 Vaccination Clinic  Name:  Timothy Davies    MRN: [id number] DOB: [birth year]  08/30/2019  Timothy Davies was observed post Covid-19 immunization for 15 minutes without incident. He was provided with Vaccine Information Sheet and instruction to access the V-Safe system.   Timothy Davies was instructed to call 911 with any severe reactions post vaccine: Marland Kitchen Difficulty breathing  . Swelling of face and throat  . A fast heartbeat  . A bad rash all over body  . Dizziness and weakness   Immunizations Administered    Name Date Dose VIS Date Route   Pfizer COVID-19 Vaccine 08/30/2019 11:28 AM 0.3 mL 07/04/2018 Intramuscular   Manufacturer: [employer], [employer]   Lot: [id number]   NDC: [address]-6

## 2019-09-18 ENCOUNTER — Ambulatory Visit: Payer: 59 | Admitting: Podiatry

## 2019-09-25 ENCOUNTER — Ambulatory Visit: Payer: 59 | Attending: Internal Medicine

## 2019-09-25 DIAGNOSIS — Z23 Encounter for immunization: Secondary | ICD-10-CM

## 2019-09-25 NOTE — Progress Notes (Signed)
   Covid-19 Vaccination Clinic  Name:  Timothy Davies    MRN: [id number] DOB: [birth year]  09/25/2019  Timothy Davies was observed post Covid-19 immunization for 15 minutes without incident. He was provided with Vaccine Information Sheet and instruction to access the V-Safe system.   Timothy Davies was instructed to call 911 with any severe reactions post vaccine: Marland Kitchen Difficulty breathing  . Swelling of face and throat  . A fast heartbeat  . A bad rash all over body  . Dizziness and weakness   Immunizations Administered    Name Date Dose VIS Date Route   Pfizer COVID-19 Vaccine 09/25/2019 11:21 AM 0.3 mL 07/04/2018 Intramuscular   Manufacturer: [employer], [employer]   Lot: [id number]   NDC: [address]-6

## 2019-09-26 ENCOUNTER — Other Ambulatory Visit: Payer: Self-pay | Admitting: Podiatry

## 2019-09-26 ENCOUNTER — Ambulatory Visit: Payer: 59

## 2019-09-26 ENCOUNTER — Ambulatory Visit: Payer: 59 | Admitting: Podiatry

## 2019-09-26 DIAGNOSIS — S93402A Sprain of unspecified ligament of left ankle, initial encounter: Secondary | ICD-10-CM

## 2019-10-15 ENCOUNTER — Other Ambulatory Visit: Payer: Self-pay | Admitting: Podiatry

## 2019-10-15 ENCOUNTER — Other Ambulatory Visit: Payer: Self-pay

## 2019-10-15 ENCOUNTER — Encounter: Payer: Self-pay | Admitting: Podiatry

## 2019-10-15 ENCOUNTER — Ambulatory Visit (INDEPENDENT_AMBULATORY_CARE_PROVIDER_SITE_OTHER): Payer: 59

## 2019-10-15 ENCOUNTER — Ambulatory Visit: Payer: 59 | Admitting: Podiatry

## 2019-10-15 DIAGNOSIS — M722 Plantar fascial fibromatosis: Secondary | ICD-10-CM

## 2019-10-15 DIAGNOSIS — S93402A Sprain of unspecified ligament of left ankle, initial encounter: Secondary | ICD-10-CM | POA: Diagnosis not present

## 2019-10-15 MED ORDER — MELOXICAM 15 MG PO TABS: 15.0000 mg | ORAL_TABLET | Freq: Every day | ORAL | 3 refills | Status: DC

## 2019-10-15 MED ORDER — METHYLPREDNISOLONE 4 MG PO TBPK
ORAL_TABLET | ORAL | 0 refills | Status: DC
Start: 2019-10-15 — End: 2021-02-13

## 2019-10-15 NOTE — Progress Notes (Signed)
  Subjective:  Patient ID: Timothy Davies, male    DOB: June 05, 1980,  MRN: [id number] HPI Chief Complaint  Patient presents with  . Foot Pain    Patient presents today for left heel/ankle pain x 8-9 months.  He states "its very painful in the mornings when I first get up and standing up after sitting a while.  It feels like a bruise when I walk"      39 y.o. male presents with the above complaint.   ROS: Denies fever chills nausea vomiting muscle aches pains calf pain back pain chest pain shortness of breath.  Past Medical History:  Diagnosis Date  . Colitis 2013   perforated cecal colitis  . History of chicken pox   . Hypertension   . Smoker    Past Surgical History:  Procedure Laterality Date  . ileocecetomy  07/17/2011   perforated cecal colitis  . LAPAROSCOPIC APPENDECTOMY  07/17/2011   Procedure: APPENDECTOMY LAPAROSCOPIC;  Surgeon: Frederik Schmidt, MD;  Location: [facility] OR;  Service: General;  Laterality: Bilateral;  . LYMPH NODE BIOPSY     as child  . TONSILLECTOMY      Current Outpatient Medications:  .  amLODipine (NORVASC) 10 MG tablet, Take 1 tablet (10 mg total) by mouth daily., Disp: 90 tablet, Rfl: 4 .  meloxicam (MOBIC) 15 MG tablet, Take 1 tablet (15 mg total) by mouth daily., Disp: 30 tablet, Rfl: 3 .  methylPREDNISolone (MEDROL DOSEPAK) 4 MG TBPK tablet, 6 day dose pack - take as directed, Disp: 21 tablet, Rfl: 0  No Known Allergies Review of Systems Objective:  There were no vitals filed for this visit.  General: Well developed, nourished, in no acute distress, alert and oriented x3   Dermatological: Skin is warm, dry and supple bilateral. Nails x 10 are well maintained; remaining integument appears unremarkable at this time. There are no open sores, no preulcerative lesions, no rash or signs of infection present.  Vascular: Dorsalis Pedis artery and Posterior Tibial artery pedal pulses are 2/4 bilateral with immedate capillary fill time. Pedal hair growth present. No  varicosities and no lower extremity edema present bilateral.   Neruologic: Grossly intact via light touch bilateral. Vibratory intact via tuning fork bilateral. Protective threshold with Semmes Wienstein monofilament intact to all pedal sites bilateral. Patellar and Achilles deep tendon reflexes 2+ bilateral. No Babinski or clonus noted bilateral.   Musculoskeletal: No gross boney pedal deformities bilateral. No pain, crepitus, or limitation noted with foot and ankle range of motion bilateral. Muscular strength 5/5 in all groups tested bilateral.  Pain on palpation medial calcaneal tubercle left.  Gait: Unassisted, Nonantalgic.    Radiographs:  Radiographs taken today demonstrate soft tissue increase in density plantar fascial kidney insertion site with mild pes planus.  Assessment & Plan:   Assessment: Plantar fasciitis left.  Plan: Discussed etiology pathology conservative versus surgical therapies.  Currently started him on methylprednisolone to be followed by meloxicam.  Placed him in a plantar fascial brace to be followed by a night splint.  Discussed appropriate shoe gear stretching exercises ice therapy sugar modifications.  Also injected the bilateral heels 20 mg of Kenalog 5 mg Marcaine point of maximal tenderness bilaterally.  Tolerated procedure well without complications.  Discussed the possible need for orthotics.  We will follow-up with him in the near future.  Also wrote him a note for him to wear tennis shoes to work.     Clothilde Tippetts T. [place], [place]

## 2019-10-15 NOTE — Patient Instructions (Signed)

## 2019-11-26 ENCOUNTER — Ambulatory Visit: Payer: 59 | Admitting: Podiatry

## 2019-11-26 ENCOUNTER — Other Ambulatory Visit: Payer: Self-pay

## 2019-11-26 ENCOUNTER — Encounter: Payer: Self-pay | Admitting: Podiatry

## 2019-11-26 DIAGNOSIS — M722 Plantar fascial fibromatosis: Secondary | ICD-10-CM

## 2019-11-26 NOTE — Progress Notes (Signed)
He presents today states that doing a whole lot better I am eating like 100% better he states that the brace really helps and I wear it every time I go work out.  He states that he is no longer taking any of his medication.  Objective: Vital signs are stable he is alert oriented x3.  There is no erythema edema cellulitis drainage or odor.  He has no pain on palpation no warmth on palpation of the plantar fasciitis insertion site of the left heel.  Assessment: 100% resolved plantar fasciitis left.  Plan: At this point I highly recommended him continue to use the plantar fascial brace and to take his medication when necessary.  We discussed how to do that and how to go about getting refills I will follow-up with him on an as-needed basis.

## 2020-03-10 ENCOUNTER — Encounter: Payer: Self-pay | Admitting: Podiatry

## 2020-03-10 ENCOUNTER — Ambulatory Visit: Payer: 59 | Admitting: Podiatry

## 2020-03-10 ENCOUNTER — Other Ambulatory Visit: Payer: Self-pay

## 2020-03-10 DIAGNOSIS — M722 Plantar fascial fibromatosis: Secondary | ICD-10-CM | POA: Diagnosis not present

## 2020-03-10 DIAGNOSIS — L600 Ingrowing nail: Secondary | ICD-10-CM

## 2020-03-10 MED ORDER — NEOMYCIN-POLYMYXIN-HC 3.5-10000-1 OT SUSP: OTIC | 0 refills | Status: DC

## 2020-03-10 NOTE — Patient Instructions (Signed)

## 2020-03-10 NOTE — Progress Notes (Signed)
  Subjective:  Patient ID: Timothy Davies, male    DOB: June 26, 1980,  MRN: [id number]  Chief Complaint  Patient presents with  . Nail Problem    Patient presents today for ingrown toenail right hallux medial border x 1-2 months    39 y.o. male presents with the above complaint. History confirmed with patient.  Has previous try to cut out himself and keeps coming back.  He has previously seen Dr. Al Corpus for plantar fasciitis  Objective:  Physical Exam: warm, good capillary refill, no trophic changes or ulcerative lesions, normal DP and PT pulses and normal sensory exam.  No heel pain today  Right Foot: Hallux medial border ingrowing without paronychia  Assessment:   1. Ingrowing right great toenail      Plan:  Patient was evaluated and treated and all questions answered.   His plantar fasciitis remains under good control and quiescent    Ingrown Nail, right -Patient elects to proceed with minor surgery to remove ingrown toenail today. Consent reviewed and signed by patient. -Ingrown nail excised. See procedure note. -Educated on post-procedure care including soaking. Written instructions provided and reviewed. -Patient to follow up in 2 weeks for nail check.  Procedure: Excision of Ingrown Toenail Location: Right 1st toe medial nail borders. Anesthesia: Lidocaine 1% plain; 1.5 mL and Marcaine 0.5% plain; 1.5 mL, digital block. Skin Prep: Betadine. Dressing: Silvadene; telfa; dry, sterile, compression dressing. Technique: Following skin prep, the toe was exsanguinated and a tourniquet was secured at the base of the toe. The affected nail border was freed, split with a nail splitter, and excised. Chemical matrixectomy was then performed with phenol and irrigated out with alcohol. The tourniquet was then removed and sterile dressing applied. Disposition: Patient tolerated procedure well. Patient to return in 2 weeks for follow-up.    Return in about 2 weeks (around 03/24/2020)  for nail re-check.

## 2020-03-24 ENCOUNTER — Ambulatory Visit: Payer: 59 | Admitting: Podiatry

## 2020-06-22 ENCOUNTER — Other Ambulatory Visit: Payer: Self-pay | Admitting: Family Medicine

## 2020-06-22 DIAGNOSIS — I10 Essential (primary) hypertension: Secondary | ICD-10-CM

## 2020-06-22 DIAGNOSIS — E7800 Pure hypercholesterolemia, unspecified: Secondary | ICD-10-CM

## 2020-06-23 ENCOUNTER — Other Ambulatory Visit (INDEPENDENT_AMBULATORY_CARE_PROVIDER_SITE_OTHER): Payer: 59

## 2020-06-23 ENCOUNTER — Other Ambulatory Visit: Payer: Self-pay

## 2020-06-23 DIAGNOSIS — I10 Essential (primary) hypertension: Secondary | ICD-10-CM | POA: Diagnosis not present

## 2020-06-23 DIAGNOSIS — E7800 Pure hypercholesterolemia, unspecified: Secondary | ICD-10-CM

## 2020-06-23 LAB — COMPREHENSIVE METABOLIC PANEL WITH GFR
ALT: 32 U/L (ref 0–53)
AST: 26 U/L (ref 0–37)
Albumin: 4.8 g/dL (ref 3.5–5.2)
Alkaline Phosphatase: 37 U/L — ABNORMAL LOW (ref 39–117)
BUN: 11 mg/dL (ref 6–23)
CO2: 26 meq/L (ref 19–32)
Calcium: 9.6 mg/dL (ref 8.4–10.5)
Chloride: 104 meq/L (ref 96–112)
Creatinine, Ser: 1.11 mg/dL (ref 0.40–1.50)
GFR: 83.38 mL/min
Glucose, Bld: 86 mg/dL (ref 70–99)
Potassium: 4.3 meq/L (ref 3.5–5.1)
Sodium: 139 meq/L (ref 135–145)
Total Bilirubin: 0.3 mg/dL (ref 0.2–1.2)
Total Protein: 8.5 g/dL — ABNORMAL HIGH (ref 6.0–8.3)

## 2020-06-23 LAB — LIPID PANEL
Cholesterol: 224 mg/dL — ABNORMAL HIGH (ref 0–200)
HDL: 71.3 mg/dL (ref >39.00)
LDL Cholesterol: 131 mg/dL — ABNORMAL HIGH (ref 0–99)
NonHDL: 152.95
Total CHOL/HDL Ratio: 3
Triglycerides: 112 mg/dL (ref 0.0–149.0)
VLDL: 22.4 mg/dL (ref 0.0–40.0)

## 2020-06-23 LAB — MICROALBUMIN / CREATININE URINE RATIO
Creatinine,U: 149.1 mg/dL
Microalb Creat Ratio: 2.1 mg/g (ref 0.0–30.0)
Microalb, Ur: 3.1 mg/dL — ABNORMAL HIGH (ref 0.0–1.9)

## 2020-06-23 NOTE — Addendum Note (Signed)
Addended by: Alvina Chou on: 06/23/2020 11:01 AM   Modules accepted: Orders

## 2020-06-25 ENCOUNTER — Encounter: Payer: 59 | Admitting: Family Medicine

## 2020-06-25 DIAGNOSIS — Z0289 Encounter for other administrative examinations: Secondary | ICD-10-CM

## 2020-09-24 ENCOUNTER — Other Ambulatory Visit: Payer: Self-pay | Admitting: Family Medicine

## 2020-10-24 ENCOUNTER — Encounter: Payer: 59 | Admitting: Family Medicine

## 2021-01-01 ENCOUNTER — Other Ambulatory Visit: Payer: Self-pay | Admitting: Family Medicine

## 2021-01-01 NOTE — Telephone Encounter (Signed)
Patient needs CPE with Dr Reece Agar, last 2 appointments for CPE were no shows. Please schedule. Thank you

## 2021-01-06 NOTE — Telephone Encounter (Signed)
LMTCB to schedule a CPE  

## 2021-01-08 NOTE — Telephone Encounter (Signed)
Noted.  E-scribed refill. 

## 2021-01-08 NOTE — Telephone Encounter (Signed)
Pt has been scheduled for 02/13/21

## 2021-02-13 ENCOUNTER — Other Ambulatory Visit: Payer: Self-pay

## 2021-02-13 ENCOUNTER — Encounter: Payer: Self-pay | Admitting: Family Medicine

## 2021-02-13 ENCOUNTER — Ambulatory Visit: Payer: 59 | Admitting: Family Medicine

## 2021-02-13 VITALS — BP 130/72 | HR 88 | Temp 97.7°F | Ht 70.0 in | Wt 245.1 lb

## 2021-02-13 DIAGNOSIS — Z0000 Encounter for general adult medical examination without abnormal findings: Secondary | ICD-10-CM

## 2021-02-13 DIAGNOSIS — I10 Essential (primary) hypertension: Secondary | ICD-10-CM

## 2021-02-13 DIAGNOSIS — Z6835 Body mass index (BMI) 35.0-35.9, adult: Secondary | ICD-10-CM

## 2021-02-13 DIAGNOSIS — E7800 Pure hypercholesterolemia, unspecified: Secondary | ICD-10-CM | POA: Diagnosis not present

## 2021-02-13 DIAGNOSIS — R195 Other fecal abnormalities: Secondary | ICD-10-CM

## 2021-02-13 MED ORDER — AMLODIPINE BESYLATE 10 MG PO TABS: 10.0000 mg | ORAL_TABLET | Freq: Every day | ORAL | 3 refills | Status: DC

## 2021-02-13 NOTE — Assessment & Plan Note (Signed)
Chronic loose stools since ileocecal removal due to perfofrated appendix (2013).  Discussed trial probiotic use vs soluble fiber like benefiber or metamucil use.

## 2021-02-13 NOTE — Assessment & Plan Note (Signed)
Chronic, stable. Continue current regimen. 

## 2021-02-13 NOTE — Patient Instructions (Addendum)
Reasonable for trial of probiotic for ongoing diarrhea (align, culturelle, philips colon health) May also increase fiber (ie soluble fiber supplement like benefiber or metamucil).  You are doing well today  Return as needed or in 1 year for next physical.   Health Maintenance, Male Adopting a healthy lifestyle and getting preventive care are important in promoting health and wellness. Ask your health care provider about: The right schedule for you to have regular tests and exams. Things you can do on your own to prevent diseases and keep yourself healthy. What should I know about diet, weight, and exercise? Eat a healthy diet  Eat a diet that includes plenty of vegetables, fruits, low-fat dairy products, and lean protein. Do not eat a lot of foods that are high in solid fats, added sugars, or sodium. Maintain a healthy weight Body mass index (BMI) is a measurement that can be used to identify possible weight problems. It estimates body fat based on height and weight. Your health care provider can help determine your BMI and help you achieve or maintain a healthy weight. Get regular exercise Get regular exercise. This is one of the most important things you can do for your health. Most adults should: Exercise for at least 150 minutes each week. The exercise should increase your heart rate and make you sweat (moderate-intensity exercise). Do strengthening exercises at least twice a week. This is in addition to the moderate-intensity exercise. Spend less time sitting. Even light physical activity can be beneficial. Watch cholesterol and blood lipids Have your blood tested for lipids and cholesterol at 40 years of age, then have this test every 5 years. You may need to have your cholesterol levels checked more often if: Your lipid or cholesterol levels are high. You are older than 40 years of age. You are at high risk for heart disease. What should I know about cancer screening? Many types of  cancers can be detected early and may often be prevented. Depending on your health history and family history, you may need to have cancer screening at various ages. This may include screening for: Colorectal cancer. Prostate cancer. Skin cancer. Lung cancer. What should I know about heart disease, diabetes, and high blood pressure? Blood pressure and heart disease High blood pressure causes heart disease and increases the risk of stroke. This is more likely to develop in people who have high blood pressure readings, are of African descent, or are overweight. Talk with your health care provider about your target blood pressure readings. Have your blood pressure checked: Every 3-5 years if you are 74-88 years of age. Every year if you are 48 years old or older. If you are between the ages of 42 and 19 and are a current or former smoker, ask your health care provider if you should have a one-time screening for abdominal aortic aneurysm (AAA). Diabetes Have regular diabetes screenings. This checks your fasting blood sugar level. Have the screening done: Once every three years after age 68 if you are at a normal weight and have a low risk for diabetes. More often and at a younger age if you are overweight or have a high risk for diabetes. What should I know about preventing infection? Hepatitis B If you have a higher risk for hepatitis B, you should be screened for this virus. Talk with your health care provider to find out if you are at risk for hepatitis B infection. Hepatitis C Blood testing is recommended for: Everyone born from [birth year] through  [birth year]. Anyone with known risk factors for hepatitis C. Sexually transmitted infections (STIs) You should be screened each year for STIs, including gonorrhea and chlamydia, if: You are sexually active and are younger than 40 years of age. You are older than 40 years of age and your health care provider tells you that you are at risk for this type of  infection. Your sexual activity has changed since you were last screened, and you are at increased risk for chlamydia or gonorrhea. Ask your health care provider if you are at risk. Ask your health care provider about whether you are at high risk for HIV. Your health care provider may recommend a prescription medicine to help prevent HIV infection. If you choose to take medicine to prevent HIV, you should first get tested for HIV. You should then be tested every 3 months for as long as you are taking the medicine. Follow these instructions at home: Lifestyle Do not use any products that contain nicotine or tobacco, such as cigarettes, e-cigarettes, and chewing tobacco. If you need help quitting, ask your health care provider. Do not use street drugs. Do not share needles. Ask your health care provider for help if you need support or information about quitting drugs. Alcohol use Do not drink alcohol if your health care provider tells you not to drink. If you drink alcohol: Limit how much you have to 0-2 drinks a day. Be aware of how much alcohol is in your drink. In the [place] one drink equals one 12 oz bottle of beer (355 mL), one 5 oz glass of wine (148 mL), or one 1 oz glass of hard liquor (44 mL). General instructions Schedule regular health, dental, and eye exams. Stay current with your vaccines. Tell your health care provider if: You often feel depressed. You have ever been abused or do not feel safe at home. Summary Adopting a healthy lifestyle and getting preventive care are important in promoting health and wellness. Follow your health care provider's instructions about healthy diet, exercising, and getting tested or screened for diseases. Follow your health care provider's instructions on monitoring your cholesterol and blood pressure. This information is not intended to replace advice given to you by your health care provider. Make sure you discuss any questions you have with your  health care provider. Document Revised: 07/04/2020 Document Reviewed: 04/19/2018 Elsevier Patient Education  2022 [employer].

## 2021-02-13 NOTE — Assessment & Plan Note (Signed)
Chronic, off meds. Encouraged increased fiber and legumes to help lower LDL. The 10-year ASCVD risk score (Arnett DK, et al., 2019) is: 4.9%   Values used to calculate the score:     Age: 40 years     Sex: Male     Is Non-Hispanic African American: Yes     Diabetic: No     Tobacco smoker: No     Systolic Blood Pressure: 130 mmHg     Is BP treated: Yes     HDL Cholesterol: 71.3 mg/dL     Total Cholesterol: 224 mg/dL

## 2021-02-13 NOTE — Assessment & Plan Note (Signed)
Preventative protocols reviewed and updated unless pt declined. Discussed healthy diet and lifestyle.  

## 2021-02-13 NOTE — Assessment & Plan Note (Signed)
Encouraged ongoing healthy diet and lifestyle choices 

## 2021-02-13 NOTE — Progress Notes (Signed)
Patient ID: Timothy Davies, male    DOB: May 05, 1981, 40 y.o.   MRN: [id number]  This visit was conducted in person.  BP 130/72   Pulse 88   Temp 97.7 F (36.5 C) (Temporal)   Ht 5\' 10"  (1.778 m)   Wt 245 lb 1 oz (111.2 kg)   SpO2 97%   BMI 35.16 kg/m    CC: CPE Subjective:   HPI: Timothy Davies is a 40 y.o. male presenting on 02/13/2021 for Annual Exam   Preventative: Flu shot - declines COVID vaccine - Pfizer 08/2019, 09/2019 Tdap 2014 Seat belt use discussed Sunscreen use discussed. No changing moles on skin. Sleep - averaging 6 hours/night  Ex-smoker - quit 10/2015. Lots of second hand smoke exposure Alcohol - 2-3 beers/day on weekends  Dentist - Q6 mo Eye exam - yearly   Caffeine - 1 pot of coffee/day.   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 Interim medical history since our last visit reviewed. Allergies and medications reviewed and updated. Outpatient Medications Prior to Visit  Medication Sig Dispense Refill   amLODipine (NORVASC) 10 MG tablet TAKE 1 TABLET BY MOUTH EVERY DAY 90 tablet 0   meloxicam (MOBIC) 15 MG tablet Take 1 tablet (15 mg total) by mouth daily. 30 tablet 3   methylPREDNISolone (MEDROL DOSEPAK) 4 MG TBPK tablet 6 day dose pack - take as directed 21 tablet 0   neomycin-polymyxin-hydrocortisone (CORTISPORIN) 3.5-10000-1 OTIC suspension Apply 1-2 drops daily after soaking and cover with bandaid 10 mL 0   No facility-administered medications prior to visit.     Per HPI unless specifically indicated in ROS section below Review of Systems  Constitutional:  Negative for activity change, appetite change, chills, fatigue, fever and unexpected weight change.  HENT:  Negative for  hearing loss.   Eyes:  Negative for visual disturbance.  Respiratory:  Negative for cough, chest tightness, shortness of breath and wheezing.   Cardiovascular:  Negative for chest pain, palpitations and leg swelling.  Gastrointestinal:  Negative for abdominal distention, abdominal pain, blood in stool, constipation, diarrhea, nausea and vomiting.  Genitourinary:  Negative for difficulty urinating and hematuria.  Musculoskeletal:  Negative for arthralgias, myalgias and neck pain.  Skin:  Negative for rash.  Neurological:  Negative for dizziness, seizures, syncope and headaches.  Hematological:  Negative for adenopathy. Does not bruise/bleed easily.  Psychiatric/Behavioral:  Negative for dysphoric mood. The patient is not nervous/anxious.    Objective:  BP 130/72   Pulse 88   Temp 97.7 F (36.5 C) (Temporal)   Ht 5\' 10"  (1.778 m)   Wt 245 lb 1 oz (111.2 kg)   SpO2 97%   BMI 35.16 kg/m   Wt Readings from Last 3 Encounters:  02/13/21 245 lb 1 oz (111.2 kg)  06/25/19 248 lb 6.4 oz (112.7 kg)  11/18/17 234 lb 12 oz (106.5 kg)      Physical Exam Vitals and nursing note reviewed.  Constitutional:      General: He is not in acute distress.    Appearance: Normal appearance. He is well-developed. He is not ill-appearing.  HENT:     Head: Normocephalic and atraumatic.     Right Ear: Hearing, tympanic membrane, ear canal and external ear  normal.     Left Ear: Hearing, tympanic membrane, ear canal and external ear normal.  Eyes:     General: No scleral icterus.    Extraocular Movements: Extraocular movements intact.     Conjunctiva/sclera: Conjunctivae normal.     Pupils: Pupils are equal, round, and reactive to light.  Neck:     Thyroid: No thyroid mass or thyromegaly.  Cardiovascular:     Rate and Rhythm: Normal rate and regular rhythm.     Pulses: Normal pulses.          Radial pulses are 2+ on the right side and 2+ on the left side.     Heart sounds: Normal heart sounds. No  murmur heard. Pulmonary:     Effort: Pulmonary effort is normal. No respiratory distress.     Breath sounds: Normal breath sounds. No wheezing, rhonchi or rales.  Abdominal:     General: Bowel sounds are normal. There is no distension.     Palpations: Abdomen is soft. There is no mass.     Tenderness: There is no abdominal tenderness. There is no guarding or rebound.     Hernia: No hernia is present.  Musculoskeletal:        General: Normal range of motion.     Cervical back: Normal range of motion and neck supple.     Right lower leg: No edema.     Left lower leg: No edema.  Lymphadenopathy:     Cervical: No cervical adenopathy.  Skin:    General: Skin is warm and dry.     Findings: No rash.  Neurological:     General: No focal deficit present.     Mental Status: He is alert and oriented to person, place, and time.  Psychiatric:        Mood and Affect: Mood normal.        Behavior: Behavior normal.        Thought Content: Thought content normal.        Judgment: Judgment normal.      Results for orders placed or performed in visit on 06/23/20  Comprehensive metabolic panel  Result Value Ref Range   Sodium 139 135 - 145 mEq/L   Potassium 4.3 slight hemolysis 3.5 - 5.1 mEq/L   Chloride 104 96 - 112 mEq/L   CO2 26 19 - 32 mEq/L   Glucose, Bld 86 70 - 99 mg/dL   BUN 11 6 - 23 mg/dL   Creatinine, Ser 6.30 0.40 - 1.50 mg/dL   Total Bilirubin 0.3 0.2 - 1.2 mg/dL   Alkaline Phosphatase 37 (L) 39 - 117 U/L   AST 26 0 - 37 U/L   ALT 32 0 - 53 U/L   Total Protein 8.5 (H) 6.0 - 8.3 g/dL   Albumin 4.8 3.5 - 5.2 g/dL   GFR 16.01 >09.32 mL/min   Calcium 9.6 8.4 - 10.5 mg/dL  Lipid panel  Result Value Ref Range   Cholesterol 224 (H) 0 - 200 mg/dL   Triglycerides 355.7 0.0 - 149.0 mg/dL   HDL 32.20 >25.42 mg/dL   VLDL 70.6 0.0 - 23.7 mg/dL   LDL Cholesterol 628 (H) 0 - 99 mg/dL   Total CHOL/HDL Ratio 3    NonHDL 152.95   Microalbumin / creatinine urine ratio  Result Value Ref  Range   Microalb, Ur 3.1 (H) 0.0 - 1.9 mg/dL   Creatinine,U 315.1 mg/dL   Microalb Creat Ratio 2.1 0.0 - 30.0 mg/g  Assessment & Plan:  This visit occurred during the SARS-CoV-2 public health emergency.  Safety protocols were in place, including screening questions prior to the visit, additional usage of staff PPE, and extensive cleaning of exam room while observing appropriate contact time as indicated for disinfecting solutions.   Problem List Items Addressed This Visit     Health maintenance examination - Primary (Chronic)    Preventative protocols reviewed and updated unless pt declined. Discussed healthy diet and lifestyle.       Hypertension    Chronic, stable. Continue current regimen.       Relevant Medications   amLODipine (NORVASC) 10 MG tablet   Severe obesity (BMI 35.0-35.9 with comorbidity) (HCC)    Encouraged ongoing healthy diet and lifestyle choices.       HLD (hyperlipidemia)    Chronic, off meds. Encouraged increased fiber and legumes to help lower LDL. 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  Discussed trial probiotic use vs soluble fiber like benefiber or metamucil use.         Meds ordered this encounter  Medications   amLODipine (NORVASC) 10 MG tablet    Sig: Take 1 tablet (10 mg total) by mouth daily.    Dispense:  90 tablet    Refill:  3   No orders of the defined types were placed in this encounter.   Patient instructions: Reasonable for trial of probiotic for ongoing  diarrhea (align, culturelle, philips colon health) May also increase fiber (ie soluble fiber supplement like benefiber or metamucil).  You are doing well today  Return as needed or in 1 year for next physical.   Follow up plan: Return in about 1 year (around 02/13/2022) for annual exam, prior fasting for blood work.  Eustaquio Boyden, MD

## 2022-02-07 ENCOUNTER — Other Ambulatory Visit: Payer: Self-pay | Admitting: Family Medicine

## 2022-02-07 DIAGNOSIS — Z1159 Encounter for screening for other viral diseases: Secondary | ICD-10-CM

## 2022-02-07 DIAGNOSIS — I10 Essential (primary) hypertension: Secondary | ICD-10-CM

## 2022-02-07 DIAGNOSIS — E7800 Pure hypercholesterolemia, unspecified: Secondary | ICD-10-CM

## 2022-02-10 ENCOUNTER — Other Ambulatory Visit (INDEPENDENT_AMBULATORY_CARE_PROVIDER_SITE_OTHER): Payer: 59

## 2022-02-10 DIAGNOSIS — Z1159 Encounter for screening for other viral diseases: Secondary | ICD-10-CM

## 2022-02-10 DIAGNOSIS — E7800 Pure hypercholesterolemia, unspecified: Secondary | ICD-10-CM | POA: Diagnosis not present

## 2022-02-10 DIAGNOSIS — I10 Essential (primary) hypertension: Secondary | ICD-10-CM

## 2022-02-10 LAB — LIPID PANEL
Cholesterol: 211 mg/dL — ABNORMAL HIGH (ref 0–200)
HDL: 64.9 mg/dL (ref >39.00)
LDL Cholesterol: 118 mg/dL — ABNORMAL HIGH (ref 0–99)
NonHDL: 146.01
Total CHOL/HDL Ratio: 3
Triglycerides: 139 mg/dL (ref 0.0–149.0)
VLDL: 27.8 mg/dL (ref 0.0–40.0)

## 2022-02-10 LAB — COMPREHENSIVE METABOLIC PANEL
ALT: 23 U/L (ref 0–53)
AST: 20 U/L (ref 0–37)
Albumin: 4.6 g/dL (ref 3.5–5.2)
Alkaline Phosphatase: 34 U/L — ABNORMAL LOW (ref 39–117)
BUN: 18 mg/dL (ref 6–23)
CO2: 26 mEq/L (ref 19–32)
Calcium: 9.3 mg/dL (ref 8.4–10.5)
Chloride: 102 mEq/L (ref 96–112)
Creatinine, Ser: 1 mg/dL (ref 0.40–1.50)
GFR: 93.42 mL/min (ref >60.00)
Glucose, Bld: 88 mg/dL (ref 70–99)
Potassium: 4.1 mEq/L (ref 3.5–5.1)
Sodium: 137 mEq/L (ref 135–145)
Total Bilirubin: 0.5 mg/dL (ref 0.2–1.2)
Total Protein: 7.8 g/dL (ref 6.0–8.3)

## 2022-02-10 LAB — MICROALBUMIN / CREATININE URINE RATIO
Creatinine,U: 152.3 mg/dL
Microalb Creat Ratio: 0.8 mg/g (ref 0.0–30.0)
Microalb, Ur: 1.3 mg/dL (ref 0.0–1.9)

## 2022-02-10 NOTE — Addendum Note (Signed)
Addended by: Ellamae Sia on: 02/10/2022 09:18 AM   Modules accepted: Orders

## 2022-02-10 NOTE — Addendum Note (Signed)
Addended by: Ellamae Sia on: 02/10/2022 09:34 AM   Modules accepted: Orders

## 2022-02-17 ENCOUNTER — Ambulatory Visit (INDEPENDENT_AMBULATORY_CARE_PROVIDER_SITE_OTHER): Payer: 59 | Admitting: Family Medicine

## 2022-02-17 ENCOUNTER — Encounter: Payer: Self-pay | Admitting: Family Medicine

## 2022-02-17 VITALS — BP 120/80 | HR 76 | Temp 97.3°F | Ht 70.0 in | Wt 244.0 lb

## 2022-02-17 DIAGNOSIS — I10 Essential (primary) hypertension: Secondary | ICD-10-CM | POA: Diagnosis not present

## 2022-02-17 DIAGNOSIS — Z87891 Personal history of nicotine dependence: Secondary | ICD-10-CM | POA: Diagnosis not present

## 2022-02-17 DIAGNOSIS — Z720 Tobacco use: Secondary | ICD-10-CM | POA: Diagnosis not present

## 2022-02-17 DIAGNOSIS — E7800 Pure hypercholesterolemia, unspecified: Secondary | ICD-10-CM

## 2022-02-17 DIAGNOSIS — Z0000 Encounter for general adult medical examination without abnormal findings: Secondary | ICD-10-CM

## 2022-02-17 DIAGNOSIS — R5383 Other fatigue: Secondary | ICD-10-CM | POA: Insufficient documentation

## 2022-02-17 DIAGNOSIS — R5382 Chronic fatigue, unspecified: Secondary | ICD-10-CM

## 2022-02-17 MED ORDER — NICOTINE POLACRILEX 4 MG MT GUM: 4.0000 mg | CHEWING_GUM | OROMUCOSAL | 0 refills | Status: DC | PRN

## 2022-02-17 MED ORDER — AMLODIPINE BESYLATE 10 MG PO TABS: 10.0000 mg | ORAL_TABLET | Freq: Every day | ORAL | 4 refills | Status: DC

## 2022-02-17 NOTE — Assessment & Plan Note (Signed)
Chronic, stable. Continue current regimen. 

## 2022-02-17 NOTE — Assessment & Plan Note (Signed)
Continued dipping since 2020, goes through 1 can every 2 days or uses pouch. Interested in quitting. [place] website, # provided. Rx for nicorette 4mg  sent to pharmacy.

## 2022-02-17 NOTE — Assessment & Plan Note (Signed)
Notes some ongoing fatigue as well as associated decreased libido - check 8am T levels at his convenience. Discussed return for confirmatory testing if low.

## 2022-02-17 NOTE — Assessment & Plan Note (Signed)
Chronic, improved. Reviewed diet choices to improve LDL cholesterol control. The 10-year ASCVD risk score (Arnett DK, et al., 2019) is: 4.6%   Values used to calculate the score:     Age: 41 years     Sex: Male     Is Non-Hispanic African American: Yes     Diabetic: No     Tobacco smoker: No     Systolic Blood Pressure: 932 mmHg     Is BP treated: Yes     HDL Cholesterol: 64.9 mg/dL     Total Cholesterol: 211 mg/dL

## 2022-02-17 NOTE — Assessment & Plan Note (Signed)
Preventative protocols reviewed and updated unless pt declined. Discussed healthy diet and lifestyle.  

## 2022-02-17 NOTE — Patient Instructions (Addendum)
Try nicorette gum to help you quit dipping. Check out [place] for more resources to help quit tobacco (1-800-QUIT-NOW) Return at your convenience for non-fasting 8am lab visit Good to see you today  Return as needed or in 1 year for next physical.   Health Maintenance, Male Adopting a healthy lifestyle and getting preventive care are important in promoting health and wellness. Ask your health care provider about: The right schedule for you to have regular tests and exams. Things you can do on your own to prevent diseases and keep yourself healthy. What should I know about diet, weight, and exercise? Eat a healthy diet  Eat a diet that includes plenty of vegetables, fruits, low-fat dairy products, and lean protein. Do not eat a lot of foods that are high in solid fats, added sugars, or sodium. Maintain a healthy weight Body mass index (BMI) is a measurement that can be used to identify possible weight problems. It estimates body fat based on height and weight. Your health care provider can help determine your BMI and help you achieve or maintain a healthy weight. Get regular exercise Get regular exercise. This is one of the most important things you can do for your health. Most adults should: Exercise for at least 150 minutes each week. The exercise should increase your heart rate and make you sweat (moderate-intensity exercise). Do strengthening exercises at least twice a week. This is in addition to the moderate-intensity exercise. Spend less time sitting. Even light physical activity can be beneficial. Watch cholesterol and blood lipids Have your blood tested for lipids and cholesterol at 41 years of age, then have this test every 5 years. You may need to have your cholesterol levels checked more often if: Your lipid or cholesterol levels are high. You are older than 41 years of age. You are at high risk for heart disease. What should I know about cancer screening? Many types of  cancers can be detected early and may often be prevented. Depending on your health history and family history, you may need to have cancer screening at various ages. This may include screening for: Colorectal cancer. Prostate cancer. Skin cancer. Lung cancer. What should I know about heart disease, diabetes, and high blood pressure? Blood pressure and heart disease High blood pressure causes heart disease and increases the risk of stroke. This is more likely to develop in people who have high blood pressure readings or are overweight. Talk with your health care provider about your target blood pressure readings. Have your blood pressure checked: Every 3-5 years if you are 87-79 years of age. Every year if you are 65 years old or older. If you are between the ages of 7 and 31 and are a current or former smoker, ask your health care provider if you should have a one-time screening for abdominal aortic aneurysm (AAA). Diabetes Have regular diabetes screenings. This checks your fasting blood sugar level. Have the screening done: Once every three years after age 11 if you are at a normal weight and have a low risk for diabetes. More often and at a younger age if you are overweight or have a high risk for diabetes. What should I know about preventing infection? Hepatitis B If you have a higher risk for hepatitis B, you should be screened for this virus. Talk with your health care provider to find out if you are at risk for hepatitis B infection. Hepatitis C Blood testing is recommended for: Everyone born from [birth year] through 1965. Anyone  with known risk factors for hepatitis C. Sexually transmitted infections (STIs) You should be screened each year for STIs, including gonorrhea and chlamydia, if: You are sexually active and are younger than 41 years of age. You are older than 41 years of age and your health care provider tells you that you are at risk for this type of infection. Your sexual  activity has changed since you were last screened, and you are at increased risk for chlamydia or gonorrhea. Ask your health care provider if you are at risk. Ask your health care provider about whether you are at high risk for HIV. Your health care provider may recommend a prescription medicine to help prevent HIV infection. If you choose to take medicine to prevent HIV, you should first get tested for HIV. You should then be tested every 3 months for as long as you are taking the medicine. Follow these instructions at home: Alcohol use Do not drink alcohol if your health care provider tells you not to drink. If you drink alcohol: Limit how much you have to 0-2 drinks a day. Know how much alcohol is in your drink. In the [place] one drink equals one 12 oz bottle of beer (355 mL), one 5 oz glass of wine (148 mL), or one 1 oz glass of hard liquor (44 mL). Lifestyle Do not use any products that contain nicotine or tobacco. These products include cigarettes, chewing tobacco, and vaping devices, such as e-cigarettes. If you need help quitting, ask your health care provider. Do not use street drugs. Do not share needles. Ask your health care provider for help if you need support or information about quitting drugs. General instructions Schedule regular health, dental, and eye exams. Stay current with your vaccines. Tell your health care provider if: You often feel depressed. You have ever been abused or do not feel safe at home. Summary Adopting a healthy lifestyle and getting preventive care are important in promoting health and wellness. Follow your health care provider's instructions about healthy diet, exercising, and getting tested or screened for diseases. Follow your health care provider's instructions on monitoring your cholesterol and blood pressure. This information is not intended to replace advice given to you by your health care provider. Make sure you discuss any questions you have  with your health care provider. Document Revised: 09/15/2020 Document Reviewed: 09/15/2020 Elsevier Patient Education  [place].

## 2022-02-17 NOTE — Assessment & Plan Note (Signed)
[name], quit cigarettes 2017 Continues smokeless tobacco - see below.

## 2022-02-17 NOTE — Progress Notes (Signed)
Patient ID: Timothy Davies, male    DOB: 05/08/1981, 41 y.o.   MRN: [id number]  This visit was conducted in person.  BP 120/80 (BP Location: Right Arm, Cuff Size: Large)   Pulse 76   Temp (!) 97.3 F (36.3 C)   Ht 5\' 10"  (1.778 m)   Wt 244 lb (110.7 kg)   SpO2 96%   BMI 35.01 kg/m   BP Readings from Last 3 Encounters:  02/17/22 120/80  02/13/21 130/72  06/25/19 132/84    CC: CPE Subjective:   HPI: Timothy Davies is a 41 y.o. male presenting on 02/17/2022 for Annual Exam (Would like to discuss possible testosterone labs.)   Chronic loose stools after abdominal surgery.  He's started intermittent fasting.  Wonders about testosterone levels - notes difficulty losing weight. Notes some fatigue and decreased libido. No depressed mood.   Preventative: Flu shot - declines COVID vaccine - Pfizer 08/2019, 09/2019, no booster Tdap 2014 Seat belt use discussed Sunscreen use discussed. No changing moles on skin. Sleep - averaging 6 hours/night  Ex-smoker - quit 10/2015. Lots of second hand smoke exposure. He's been dipping since 2020 - 1 can every other day. Desires nicotine replacement.  Alcohol - only on weekends - has cut down  Dentist - Q6 mo Eye exam - yearly  Caffeine - 1 pot of coffee/day.   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 Interim medical history since our last visit reviewed. Allergies and medications reviewed and updated. Outpatient Medications Prior to Visit  Medication Sig Dispense Refill   amLODipine (NORVASC) 10 MG tablet Take 1 tablet (10 mg total) by mouth daily. 90 tablet 3   No facility-administered medications prior to visit.     Per HPI unless specifically indicated in ROS section below Review  of Systems  Constitutional:  Negative for activity change, appetite change, chills, fatigue, fever and unexpected weight change.  HENT:  Negative for hearing loss.   Eyes:  Negative for visual disturbance.  Respiratory:  Negative for cough, chest tightness, shortness of breath and wheezing.   Cardiovascular:  Negative for chest pain, palpitations and leg swelling.  Gastrointestinal:  Negative for abdominal distention, abdominal pain, blood in stool, constipation, diarrhea, nausea and vomiting.  Genitourinary:  Negative for difficulty urinating and hematuria.  Musculoskeletal:  Negative for arthralgias, myalgias and neck pain.  Skin:  Negative for rash.  Neurological:  Negative for dizziness, seizures, syncope and headaches.  Hematological:  Negative for adenopathy. Does not bruise/bleed easily.  Psychiatric/Behavioral:  Negative for dysphoric mood. The patient is not nervous/anxious.     Objective:  BP 120/80 (BP Location: Right Arm, Cuff Size: Large)   Pulse 76   Temp (!) 97.3 F (36.3 C)   Ht 5\' 10"  (1.778 m)   Wt 244 lb (110.7 kg)   SpO2 96%   BMI 35.01 kg/m   Wt Readings from Last 3 Encounters:  02/17/22 244 lb (110.7 kg)  02/13/21 245 lb 1 oz (111.2 kg)  06/25/19 248 lb 6.4 oz (112.7 kg)      Physical Exam Vitals and nursing note reviewed.  Constitutional:      General: He is not in acute distress.    Appearance: Normal appearance. He is well-developed. He is not ill-appearing.  HENT:     Head: Normocephalic and atraumatic.     Right Ear: Hearing, tympanic membrane, ear canal and external ear normal.     Left Ear: Hearing, tympanic membrane, ear canal and external ear normal.  Eyes:     General: No scleral icterus.    Extraocular Movements: Extraocular movements intact.     Conjunctiva/sclera: Conjunctivae normal.     Pupils: Pupils are equal, round, and reactive to light.  Neck:     Thyroid: No thyroid mass or thyromegaly.  Cardiovascular:     Rate and Rhythm:  Normal rate and regular rhythm.     Pulses: Normal pulses.          Radial pulses are 2+ on the right side and 2+ on the left side.     Heart sounds: Normal heart sounds. No murmur heard. Pulmonary:     Effort: Pulmonary effort is normal. No respiratory distress.     Breath sounds: Normal breath sounds. No wheezing, rhonchi or rales.  Abdominal:     General: Bowel sounds are normal. There is no distension.     Palpations: Abdomen is soft. There is no mass.     Tenderness: There is no abdominal tenderness. There is no guarding or rebound.     Hernia: No hernia is present.  Musculoskeletal:        General: Normal range of motion.     Cervical back: Normal range of motion and neck supple.     Right lower leg: No edema.     Left lower leg: No edema.  Lymphadenopathy:     Cervical: No cervical adenopathy.  Skin:    General: Skin is warm and dry.     Findings: No rash.  Neurological:     General: No focal deficit present.     Mental Status: He is alert and oriented to person, place, and time.  Psychiatric:        Mood and Affect: Mood normal.        Behavior: Behavior normal.        Thought Content: Thought content normal.        Judgment: Judgment normal.       Results for orders placed or performed in visit on 02/10/22  Microalbumin / creatinine urine ratio  Result Value Ref Range   Microalb, Ur 1.3 0.0 - 1.9 mg/dL   Creatinine,U 152.3 mg/dL   Microalb Creat Ratio 0.8 0.0 - 30.0 mg/g  Comprehensive metabolic panel  Result Value Ref Range   Sodium 137 135 - 145 mEq/L   Potassium 4.1 3.5 - 5.1 mEq/L   Chloride 102 96 - 112 mEq/L   CO2 26 19 - 32 mEq/L   Glucose, Bld 88 70 - 99 mg/dL   BUN 18 6 - 23 mg/dL   Creatinine, Ser 1.00 0.40 - 1.50 mg/dL   Total Bilirubin 0.5 0.2 - 1.2 mg/dL   Alkaline Phosphatase 34 (L) 39 - 117 U/L   AST 20 0 - 37 U/L   ALT 23 0 - 53 U/L   Total Protein 7.8 6.0 - 8.3 g/dL   Albumin 4.6 3.5 - 5.2 g/dL   GFR 93.42 >60.00 mL/min   Calcium 9.3  8.4 - 10.5 mg/dL  Lipid panel  Result Value Ref Range   Cholesterol 211 (H) 0 - 200 mg/dL   Triglycerides 139.0 0.0 - 149.0 mg/dL   HDL 64.90 >39.00 mg/dL   VLDL 27.8 0.0 - 40.0 mg/dL   LDL Cholesterol 118 (  H) 0 - 99 mg/dL   Total CHOL/HDL Ratio 3    NonHDL 146.01     Assessment & Plan:   Problem List Items Addressed This Visit     Health maintenance examination - Primary (Chronic)    Preventative protocols reviewed and updated unless pt declined. Discussed healthy diet and lifestyle.       Hypertension    Chronic, stable. Continue current regimen.       Relevant Medications   amLODipine (NORVASC) 10 MG tablet   Ex-smoker    [name], quit cigarettes 2017 Continues smokeless tobacco - see below.       HLD (hyperlipidemia)    Chronic, improved. Reviewed diet choices to improve LDL cholesterol control. The 10-year ASCVD risk score (Arnett DK, et al., 2019) is: 4.6%   Values used to calculate the score:     Age: 64 years     Sex: Male     Is Non-Hispanic African American: Yes     Diabetic: No     Tobacco smoker: No     Systolic Blood Pressure: 120 mmHg     Is BP treated: Yes     HDL Cholesterol: 64.9 mg/dL     Total Cholesterol: 211 mg/dL       Relevant Medications   amLODipine (NORVASC) 10 MG tablet   Smokeless tobacco use    Continued dipping since 2020, goes through 1 can every 2 days or uses pouch. Interested in quitting. [facility] website, # provided. Rx for nicorette 4mg  sent to pharmacy.       Fatigue    Notes some ongoing fatigue as well as associated decreased libido - check 8am T levels at his convenience. Discussed return for confirmatory testing if low.       Relevant Orders   Testosterone     Meds ordered this encounter  Medications   amLODipine (NORVASC) 10 MG tablet    Sig: Take 1 tablet (10 mg total) by mouth daily.    Dispense:  90 tablet    Refill:  4   nicotine polacrilex (NICORETTE) 4 MG gum    Sig: Take 1 each (4 mg  total) by mouth as needed for smoking cessation.    Dispense:  100 tablet    Refill:  0   Orders Placed This Encounter  Procedures   Testosterone    Standing Status:   Future    Standing Expiration Date:   02/18/2023     Patient instructions: Try nicorette gum to help you quit dipping. Check out [facility]com for more resources to help quit tobacco (1-800-QUIT-NOW) Return at your convenience for nonfasting 8am lab visit Good to see you today  Return as needed or in 1 year for next physical.   Follow up plan: Return in about 1 year (around 02/18/2023) for annual exam, prior fasting for blood work.  04/20/2023, MD

## 2023-02-07 ENCOUNTER — Other Ambulatory Visit: Payer: Self-pay | Admitting: Family Medicine

## 2023-02-07 DIAGNOSIS — Z1159 Encounter for screening for other viral diseases: Secondary | ICD-10-CM

## 2023-02-07 DIAGNOSIS — I10 Essential (primary) hypertension: Secondary | ICD-10-CM

## 2023-02-07 DIAGNOSIS — E7800 Pure hypercholesterolemia, unspecified: Secondary | ICD-10-CM

## 2023-02-07 DIAGNOSIS — Z125 Encounter for screening for malignant neoplasm of prostate: Secondary | ICD-10-CM

## 2023-02-14 ENCOUNTER — Other Ambulatory Visit (INDEPENDENT_AMBULATORY_CARE_PROVIDER_SITE_OTHER): Payer: 59

## 2023-02-14 DIAGNOSIS — Z1159 Encounter for screening for other viral diseases: Secondary | ICD-10-CM

## 2023-02-14 DIAGNOSIS — Z125 Encounter for screening for malignant neoplasm of prostate: Secondary | ICD-10-CM | POA: Diagnosis not present

## 2023-02-14 DIAGNOSIS — R5382 Chronic fatigue, unspecified: Secondary | ICD-10-CM

## 2023-02-14 DIAGNOSIS — E7800 Pure hypercholesterolemia, unspecified: Secondary | ICD-10-CM

## 2023-02-14 DIAGNOSIS — I10 Essential (primary) hypertension: Secondary | ICD-10-CM | POA: Diagnosis not present

## 2023-02-15 LAB — COMPREHENSIVE METABOLIC PANEL
ALT: 44 U/L (ref 0–53)
AST: 29 U/L (ref 0–37)
Albumin: 4.5 g/dL (ref 3.5–5.2)
Alkaline Phosphatase: 37 U/L — ABNORMAL LOW (ref 39–117)
BUN: 14 mg/dL (ref 6–23)
CO2: 24 meq/L (ref 19–32)
Calcium: 9.2 mg/dL (ref 8.4–10.5)
Chloride: 101 meq/L (ref 96–112)
Creatinine, Ser: 1.1 mg/dL (ref 0.40–1.50)
GFR: 82.74 mL/min (ref >60.00)
Glucose, Bld: 94 mg/dL (ref 70–99)
Potassium: 3.9 meq/L (ref 3.5–5.1)
Sodium: 133 meq/L — ABNORMAL LOW (ref 135–145)
Total Bilirubin: 0.4 mg/dL (ref 0.2–1.2)
Total Protein: 7.3 g/dL (ref 6.0–8.3)

## 2023-02-15 LAB — LIPID PANEL
Cholesterol: 225 mg/dL — ABNORMAL HIGH (ref 0–200)
HDL: 58 mg/dL (ref >39.00)
Total CHOL/HDL Ratio: 4
Triglycerides: 416 mg/dL — ABNORMAL HIGH (ref 0.0–149.0)

## 2023-02-15 LAB — MICROALBUMIN / CREATININE URINE RATIO
Creatinine,U: 115 mg/dL
Microalb Creat Ratio: 0.6 mg/g (ref 0.0–30.0)
Microalb, Ur: <0.7 mg/dL (ref 0.0–1.9)

## 2023-02-15 LAB — LDL CHOLESTEROL, DIRECT: Direct LDL: 111 mg/dL

## 2023-02-15 LAB — TESTOSTERONE: Testosterone: 136.74 ng/dL — ABNORMAL LOW (ref 300.00–890.00)

## 2023-02-15 LAB — PSA: PSA: 0.26 ng/mL (ref 0.10–4.00)

## 2023-02-15 LAB — HEPATITIS C ANTIBODY: Hepatitis C Ab: NONREACTIVE

## 2023-02-21 ENCOUNTER — Encounter: Payer: Self-pay | Admitting: Family Medicine

## 2023-02-21 ENCOUNTER — Ambulatory Visit: Payer: 59 | Admitting: Family Medicine

## 2023-02-21 VITALS — BP 126/82 | HR 91 | Temp 98.3°F | Ht 69.75 in | Wt 253.1 lb

## 2023-02-21 DIAGNOSIS — I10 Essential (primary) hypertension: Secondary | ICD-10-CM | POA: Diagnosis not present

## 2023-02-21 DIAGNOSIS — Z6835 Body mass index (BMI) 35.0-35.9, adult: Secondary | ICD-10-CM

## 2023-02-21 DIAGNOSIS — B360 Pityriasis versicolor: Secondary | ICD-10-CM

## 2023-02-21 DIAGNOSIS — E7800 Pure hypercholesterolemia, unspecified: Secondary | ICD-10-CM

## 2023-02-21 DIAGNOSIS — Z720 Tobacco use: Secondary | ICD-10-CM

## 2023-02-21 DIAGNOSIS — R7989 Other specified abnormal findings of blood chemistry: Secondary | ICD-10-CM

## 2023-02-21 DIAGNOSIS — Z0000 Encounter for general adult medical examination without abnormal findings: Secondary | ICD-10-CM

## 2023-02-21 MED ORDER — NICOTINE POLACRILEX 4 MG MT GUM: 4.0000 mg | CHEWING_GUM | OROMUCOSAL | 0 refills | Status: AC | PRN

## 2023-02-21 NOTE — Assessment & Plan Note (Signed)
Chronic, actually BP readings have normalized without antihypertensive.  Will remain off at this time, I did ask him to monitor readings intermittently at home, let us know if trending up

## 2023-02-21 NOTE — Assessment & Plan Note (Signed)
Discussed selsun blue topically as well as PRN clotrimazole cream.

## 2023-02-21 NOTE — Progress Notes (Signed)
Ph: [phone] Fax: [phone]   Patient ID: Timothy Davies, male    DOB: 09-Jun-1980, 42 y.o.   MRN: [id number]  This visit was conducted in person.  BP 126/82   Pulse 91   Temp 98.3 F (36.8 C) (Oral)   Ht 5' 9.75" (1.772 m)   Wt 253 lb 2 oz (114.8 kg)   SpO2 97%   BMI 36.58 kg/m    CC: CPE Subjective:   HPI: Timothy Davies is a 42 y.o. male presenting on 02/21/2023 for Annual Exam   BP stable off medication.  L ankle stiffness and swelling present over the past 3-4 months, denies inciting trauma/injury or falls. No redness or warmth or bruising.   Ongoing difficulty losing weight, some fatigue and decreased libido. No depressed mood.    Preventative: No fmhx colon cancer  No fmhx prostate cancer  Flu shot - declines COVID vaccine - Pfizer 08/2019, 09/2019, no booster Tdap 2014 Seat belt use discussed Sunscreen use discussed. No changing moles on skin.  Sleep - averaging 6 hours/night  Ex-smoker - quit 10/2015. He's been dipping since 2020 - 1 can every other day. Desires nicotine replacement. Some second hand smoke exposure.  Alcohol - only on weekends  Dentist - Q6 mo Eye exam - yearly  Caffeine - 1 pot of coffee/day.   Lives with wife and 2 children Occupation: Teaching laboratory technician at [employer]  Tobacco  Edu: college Activity: decreased activity over the last few months. Does have [employer] membership, using treadmill at home Diet: good water, fruits/vegetables, meat daily      Relevant past medical, surgical, family and social history reviewed and updated as indicated. Interim medical history since our last visit reviewed. Allergies and medications reviewed and updated. Outpatient Medications Prior to Visit  Medication Sig Dispense Refill   amLODipine (NORVASC) 10 MG tablet Take 1 tablet (10 mg total) by mouth daily. 90 tablet 4   nicotine polacrilex (NICORETTE) 4 MG gum Take 1 each (4 mg total) by mouth as needed for smoking cessation. 100 tablet 0   No  facility-administered medications prior to visit.     Per HPI unless specifically indicated in ROS section below Review of Systems  Constitutional:  Positive for appetite change (increased). Negative for activity change, chills, fatigue, fever and unexpected weight change.  HENT:  Negative for hearing loss.   Eyes:  Negative for visual disturbance.  Respiratory:  Negative for cough, chest tightness, shortness of breath and wheezing.   Cardiovascular:  Negative for chest pain, palpitations and leg swelling.  Gastrointestinal:  Negative for abdominal distention, abdominal pain, blood in stool, constipation, diarrhea, nausea and vomiting.  Genitourinary:  Negative for difficulty urinating and hematuria.  Musculoskeletal:  Negative for arthralgias, myalgias and neck pain.  Skin:  Negative for rash.  Neurological:  Negative for dizziness, seizures, syncope and headaches.  Hematological:  Negative for adenopathy. Does not bruise/bleed easily.  Psychiatric/Behavioral:  Negative for dysphoric mood. The patient is not nervous/anxious.   All other systems reviewed and are negative.   Objective:  BP 126/82   Pulse 91   Temp 98.3 F (36.8 C) (Oral)   Ht 5' 9.75" (1.772 m)   Wt 253 lb 2 oz (114.8 kg)   SpO2 97%   BMI 36.58 kg/m   Wt Readings from Last 3 Encounters:  02/21/23 253 lb 2 oz (114.8 kg)  02/17/22 244 lb (110.7 kg)  02/13/21 245 lb 1 oz (111.2 kg)  Physical Exam Vitals and nursing note reviewed.  Constitutional:      General: He is not in acute distress.    Appearance: Normal appearance. He is well-developed. He is not ill-appearing.  HENT:     Head: Normocephalic and atraumatic.     Right Ear: Hearing, tympanic membrane, ear canal and external ear normal.     Left Ear: Hearing, tympanic membrane, ear canal and external ear normal.     Mouth/Throat:     Mouth: Mucous membranes are moist.     Pharynx: Oropharynx is clear. No oropharyngeal exudate or posterior  oropharyngeal erythema.  Eyes:     General: No scleral icterus.    Extraocular Movements: Extraocular movements intact.     Conjunctiva/sclera: Conjunctivae normal.     Pupils: Pupils are equal, round, and reactive to light.  Neck:     Thyroid: No thyroid mass or thyromegaly.  Cardiovascular:     Rate and Rhythm: Normal rate and regular rhythm.     Pulses: Normal pulses.          Radial pulses are 2+ on the right side and 2+ on the left side.     Heart sounds: Normal heart sounds. No murmur heard. Pulmonary:     Effort: Pulmonary effort is normal. No respiratory distress.     Breath sounds: Normal breath sounds. No wheezing, rhonchi or rales.  Abdominal:     General: Bowel sounds are normal. There is no distension.     Palpations: Abdomen is soft. There is no mass.     Tenderness: There is no abdominal tenderness. There is no guarding or rebound.     Hernia: No hernia is present.  Musculoskeletal:        General: Normal range of motion.     Cervical back: Normal range of motion and neck supple.     Right lower leg: No edema.     Left lower leg: No edema.  Lymphadenopathy:     Cervical: No cervical adenopathy.  Skin:    General: Skin is warm and dry.     Findings: Rash present.     Comments: Hyperpigmented round patches to bilateral neck, spares back and trunk   Neurological:     General: No focal deficit present.     Mental Status: He is alert and oriented to person, place, and time.  Psychiatric:        Mood and Affect: Mood normal.        Behavior: Behavior normal.        Thought Content: Thought content normal.        Judgment: Judgment normal.       Results for orders placed or performed in visit on 02/14/23  Testosterone  Result Value Ref Range   Testosterone 136.74 (L) 300.00 - 890.00 ng/dL  Lipid panel  Result Value Ref Range   Cholesterol 225 (H) 0 - 200 mg/dL   Triglycerides (H) 0.0 - 149.0 mg/dL    914.7 Triglyceride is over 400; calculations on Lipids are  invalid.   HDL 58.00 >39.00 mg/dL   Total CHOL/HDL Ratio 4   Comprehensive metabolic panel  Result Value Ref Range   Sodium 133 (L) 135 - 145 mEq/L   Potassium 3.9 3.5 - 5.1 mEq/L   Chloride 101 96 - 112 mEq/L   CO2 24 19 - 32 mEq/L   Glucose, Bld 94 70 - 99 mg/dL   BUN 14 6 - 23 mg/dL   Creatinine, Ser 8.29 0.40 -  1.50 mg/dL   Total Bilirubin 0.4 0.2 - 1.2 mg/dL   Alkaline Phosphatase 37 (L) 39 - 117 U/L   AST 29 0 - 37 U/L   ALT 44 0 - 53 U/L   Total Protein 7.3 6.0 - 8.3 g/dL   Albumin 4.5 3.5 - 5.2 g/dL   GFR 40.98 >11.91 mL/min   Calcium 9.2 8.4 - 10.5 mg/dL  Microalbumin / creatinine urine ratio  Result Value Ref Range   Microalb, Ur <0.7 0.0 - 1.9 mg/dL   Creatinine,U 478.2 mg/dL   Microalb Creat Ratio 0.6 0.0 - 30.0 mg/g  PSA  Result Value Ref Range   PSA 0.26 0.10 - 4.00 ng/mL  Hepatitis C antibody  Result Value Ref Range   Hepatitis C Ab NON-REACTIVE NON-REACTIVE  LDL cholesterol, direct  Result Value Ref Range   Direct LDL 111.0 mg/dL    Assessment & Plan:   Problem List Items Addressed This Visit     Health maintenance examination - Primary (Chronic)    Preventative protocols reviewed and updated unless pt declined. Discussed healthy diet and lifestyle.       Hypertension    Chronic, actually BP readings have normalized without antihypertensive.  Will remain off at this time, I did ask him to monitor readings intermittently at home, let us know if trending up      Severe obesity (BMI 35.0-35.9 with comorbidity) (HCC)    Encourage healthy diet and lifestyle choices to affect sustainable weight loss.       HLD (hyperlipidemia)    Chronic, stable off medication The 10-year ASCVD risk score (Arnett DK, et al., 2019) is: 3.4%   Values used to calculate the score:     Age: 25 years     Sex: Male     Is Non-Hispanic African American: Yes     Diabetic: No     Tobacco smoker: No     Systolic Blood Pressure: 126 mmHg     Is BP treated: No     HDL  Cholesterol: 58 mg/dL     Total Cholesterol: 225 mg/dL       Relevant Orders   Lipid panel   Smokeless tobacco use    Refill nicorette gum to use PRN.       Low testosterone in male    First T levels were not done early am.  Will return for confirmatory testing along with other hormone levels.       Relevant Orders   TSH   T4, free   IBC panel   Prolactin   Cortisol-am, blood   Testosterone Total,Free,Bio, Males   Luteinizing hormone   Follicle stimulating hormone   Tinea versicolor    Discussed selsun blue topically as well as PRN clotrimazole cream.         Meds ordered this encounter  Medications   nicotine polacrilex (NICORETTE) 4 MG gum    Sig: Take 1 each (4 mg total) by mouth as needed for smoking cessation.    Dispense:  100 tablet    Refill:  0    Orders Placed This Encounter  Procedures   TSH    Standing Status:   Future    Standing Expiration Date:   02/21/2024   T4, free    Standing Status:   Future    Standing Expiration Date:   02/21/2024   IBC panel    Standing Status:   Future    Standing Expiration Date:   02/21/2024  Prolactin    Standing Status:   Future    Standing Expiration Date:   02/21/2024   Cortisol-am, blood    Standing Status:   Future    Standing Expiration Date:   02/21/2024   Testosterone Total,Free,Bio, Males    Standing Status:   Future    Standing Expiration Date:   02/21/2024   Luteinizing hormone    Standing Status:   Future    Standing Expiration Date:   02/21/2024   Follicle stimulating hormone    Standing Status:   Future    Standing Expiration Date:   02/21/2024   Lipid panel    Standing Status:   Future    Standing Expiration Date:   02/21/2024    Patient Instructions  Return at your convenience for early am fasting lab visit to recheck cholesterol and testosterone/other hormones. If testosterone levels remain low, we may have you return to discuss treatment if interested.  May use selsun blue or  clotrimazole antifungal cream to dark patches on neck (tinea versicolor).  Return as needed or in 1 year for next physical.   Follow up plan: Return in about 1 year (around 02/21/2024) for annual exam, prior fasting for blood work.  Eustaquio Boyden, MD

## 2023-02-21 NOTE — Assessment & Plan Note (Signed)
Encourage healthy diet and lifestyle choices to affect sustainable weight loss.

## 2023-02-21 NOTE — Assessment & Plan Note (Signed)
Preventative protocols reviewed and updated unless pt declined. Discussed healthy diet and lifestyle.  

## 2023-02-21 NOTE — Assessment & Plan Note (Signed)
Refill nicorette gum to use PRN.

## 2023-02-21 NOTE — Assessment & Plan Note (Signed)
First T levels were not done early am.  Will return for confirmatory testing along with other hormone levels.

## 2023-02-21 NOTE — Patient Instructions (Addendum)
Return at your convenience for early am fasting lab visit to recheck cholesterol and testosterone/other hormones. If testosterone levels remain low, we may have you return to discuss treatment if interested.  May use selsun blue or clotrimazole antifungal cream to dark patches on neck (tinea versicolor).  Return as needed or in 1 year for next physical.

## 2023-02-21 NOTE — Assessment & Plan Note (Signed)
Chronic, stable off medication The 10-year ASCVD risk score (Arnett DK, et al., 2019) is: 3.4%   Values used to calculate the score:     Age: 42 years     Sex: Male     Is Non-Hispanic African American: Yes     Diabetic: No     Tobacco smoker: No     Systolic Blood Pressure: 126 mmHg     Is BP treated: No     HDL Cholesterol: 58 mg/dL     Total Cholesterol: 225 mg/dL

## 2023-02-22 ENCOUNTER — Other Ambulatory Visit (INDEPENDENT_AMBULATORY_CARE_PROVIDER_SITE_OTHER): Payer: 59

## 2023-02-22 DIAGNOSIS — E7800 Pure hypercholesterolemia, unspecified: Secondary | ICD-10-CM | POA: Diagnosis not present

## 2023-02-22 DIAGNOSIS — R7989 Other specified abnormal findings of blood chemistry: Secondary | ICD-10-CM

## 2023-02-22 LAB — FOLLICLE STIMULATING HORMONE: FSH: 7 m[IU]/mL (ref 1.4–18.1)

## 2023-02-22 LAB — LIPID PANEL
Cholesterol: 229 mg/dL — ABNORMAL HIGH (ref 0–200)
HDL: 60.8 mg/dL (ref >39.00)
LDL Cholesterol: 113 mg/dL — ABNORMAL HIGH (ref 0–99)
NonHDL: 168.64
Total CHOL/HDL Ratio: 4
Triglycerides: 277 mg/dL — ABNORMAL HIGH (ref 0.0–149.0)
VLDL: 55.4 mg/dL — ABNORMAL HIGH (ref 0.0–40.0)

## 2023-02-22 LAB — TSH: TSH: 1.91 u[IU]/mL (ref 0.35–5.50)

## 2023-02-22 LAB — T4, FREE: Free T4: 0.82 ng/dL (ref 0.60–1.60)

## 2023-02-22 LAB — IBC PANEL
Iron: 124 ug/dL (ref 42–165)
Saturation Ratios: 37.5 % (ref 20.0–50.0)
TIBC: 330.4 ug/dL (ref 250.0–450.0)
Transferrin: 236 mg/dL (ref 212.0–360.0)

## 2023-02-22 LAB — LUTEINIZING HORMONE: LH: 3.31 m[IU]/mL (ref 1.50–9.30)

## 2023-02-23 LAB — TESTOSTERONE TOTAL,FREE,BIO, MALES
Albumin: 4.6 g/dL (ref 3.6–5.1)
Sex Hormone Binding: 21 nmol/L (ref 10–50)
Testosterone, Bioavailable: 127.9 ng/dL (ref 110.0–575.0)
Testosterone, Free: 60.9 pg/mL (ref 46.0–224.0)
Testosterone: 339 ng/dL (ref 250–827)

## 2023-02-23 LAB — PROLACTIN: Prolactin: 11 ng/mL (ref 2.0–18.0)

## 2023-02-23 LAB — CORTISOL-AM, BLOOD: Cortisol - AM: 21.7 ug/dL

## 2023-04-08 ENCOUNTER — Other Ambulatory Visit: Payer: Self-pay | Admitting: Family Medicine
